# Patient Record
Sex: Female | Born: 1970 | Race: White | Hispanic: No | Marital: Married | Smoking: Former smoker
Health system: Southern US, Community
[De-identification: ages and names within clinical notes are randomized; demographics above are authoritative.]

## PROBLEM LIST (undated history)

## (undated) DIAGNOSIS — K222 Esophageal obstruction: Secondary | ICD-10-CM

## (undated) DIAGNOSIS — K219 Gastro-esophageal reflux disease without esophagitis: Secondary | ICD-10-CM

## (undated) DIAGNOSIS — N840 Polyp of corpus uteri: Secondary | ICD-10-CM

## (undated) DIAGNOSIS — R51 Headache: Secondary | ICD-10-CM

## (undated) DIAGNOSIS — S92909A Unspecified fracture of unspecified foot, initial encounter for closed fracture: Secondary | ICD-10-CM

## (undated) DIAGNOSIS — R011 Cardiac murmur, unspecified: Secondary | ICD-10-CM

## (undated) DIAGNOSIS — K635 Polyp of colon: Secondary | ICD-10-CM

## (undated) DIAGNOSIS — K449 Diaphragmatic hernia without obstruction or gangrene: Secondary | ICD-10-CM

## (undated) DIAGNOSIS — G8929 Other chronic pain: Secondary | ICD-10-CM

## (undated) DIAGNOSIS — R519 Headache, unspecified: Secondary | ICD-10-CM

## (undated) HISTORY — PX: OTHER SURGICAL HISTORY: SHX169

## (undated) HISTORY — DX: Gastro-esophageal reflux disease without esophagitis: K21.9

## (undated) HISTORY — DX: Polyp of colon: K63.5

## (undated) HISTORY — DX: Headache, unspecified: R51.9

## (undated) HISTORY — DX: Polyp of corpus uteri: N84.0

## (undated) HISTORY — DX: Other chronic pain: G89.29

## (undated) HISTORY — DX: Esophageal obstruction: K22.2

## (undated) HISTORY — PX: COLONOSCOPY: SHX174

## (undated) HISTORY — DX: Headache: R51

## (undated) HISTORY — DX: Diaphragmatic hernia without obstruction or gangrene: K44.9

## (undated) HISTORY — DX: Cardiac murmur, unspecified: R01.1

## (undated) HISTORY — PX: POLYPECTOMY: SHX149

---

## 1997-10-28 ENCOUNTER — Ambulatory Visit (HOSPITAL_COMMUNITY): Admission: RE | Admit: 1997-10-28 | Discharge: 1997-10-28 | Payer: Self-pay | Admitting: Internal Medicine

## 1998-10-24 ENCOUNTER — Other Ambulatory Visit: Admission: RE | Admit: 1998-10-24 | Discharge: 1998-10-24 | Payer: Self-pay | Admitting: Obstetrics and Gynecology

## 1999-08-22 ENCOUNTER — Other Ambulatory Visit: Admission: RE | Admit: 1999-08-22 | Discharge: 1999-08-22 | Payer: Self-pay | Admitting: Obstetrics and Gynecology

## 2000-03-29 ENCOUNTER — Inpatient Hospital Stay (HOSPITAL_COMMUNITY): Admission: AD | Admit: 2000-03-29 | Discharge: 2000-04-01 | Payer: Self-pay | Admitting: Obstetrics and Gynecology

## 2000-09-09 ENCOUNTER — Other Ambulatory Visit: Admission: RE | Admit: 2000-09-09 | Discharge: 2000-09-09 | Payer: Self-pay | Admitting: Obstetrics and Gynecology

## 2001-09-24 ENCOUNTER — Other Ambulatory Visit: Admission: RE | Admit: 2001-09-24 | Discharge: 2001-09-24 | Payer: Self-pay | Admitting: Obstetrics and Gynecology

## 2002-04-08 ENCOUNTER — Inpatient Hospital Stay (HOSPITAL_COMMUNITY): Admission: AD | Admit: 2002-04-08 | Discharge: 2002-04-10 | Payer: Self-pay | Admitting: *Deleted

## 2002-10-07 ENCOUNTER — Other Ambulatory Visit: Admission: RE | Admit: 2002-10-07 | Discharge: 2002-10-07 | Payer: Self-pay | Admitting: Obstetrics and Gynecology

## 2004-06-20 ENCOUNTER — Other Ambulatory Visit: Admission: RE | Admit: 2004-06-20 | Discharge: 2004-06-20 | Payer: Self-pay | Admitting: Obstetrics and Gynecology

## 2004-10-24 ENCOUNTER — Ambulatory Visit (HOSPITAL_COMMUNITY): Admission: RE | Admit: 2004-10-24 | Discharge: 2004-10-24 | Payer: Self-pay | Admitting: Orthopedic Surgery

## 2005-04-11 ENCOUNTER — Encounter: Admission: RE | Admit: 2005-04-11 | Discharge: 2005-04-11 | Payer: Self-pay | Admitting: Internal Medicine

## 2005-06-21 ENCOUNTER — Other Ambulatory Visit: Admission: RE | Admit: 2005-06-21 | Discharge: 2005-06-21 | Payer: Self-pay | Admitting: Obstetrics and Gynecology

## 2007-01-03 ENCOUNTER — Emergency Department: Payer: Self-pay | Admitting: Emergency Medicine

## 2007-01-12 ENCOUNTER — Encounter (INDEPENDENT_AMBULATORY_CARE_PROVIDER_SITE_OTHER): Payer: Self-pay | Admitting: Obstetrics and Gynecology

## 2007-01-12 ENCOUNTER — Ambulatory Visit (HOSPITAL_COMMUNITY): Admission: RE | Admit: 2007-01-12 | Discharge: 2007-01-12 | Payer: Self-pay | Admitting: Obstetrics and Gynecology

## 2007-10-02 ENCOUNTER — Encounter: Admission: RE | Admit: 2007-10-02 | Discharge: 2007-10-02 | Payer: Self-pay | Admitting: Internal Medicine

## 2007-10-27 ENCOUNTER — Ambulatory Visit: Payer: Self-pay | Admitting: Gastroenterology

## 2008-01-26 ENCOUNTER — Telehealth: Payer: Self-pay | Admitting: Gastroenterology

## 2008-02-08 ENCOUNTER — Ambulatory Visit: Payer: Self-pay | Admitting: Gastroenterology

## 2008-03-15 ENCOUNTER — Telehealth: Payer: Self-pay | Admitting: Gastroenterology

## 2008-03-22 ENCOUNTER — Encounter: Payer: Self-pay | Admitting: Gastroenterology

## 2008-03-22 ENCOUNTER — Ambulatory Visit: Payer: Self-pay | Admitting: Gastroenterology

## 2008-03-22 DIAGNOSIS — R1084 Generalized abdominal pain: Secondary | ICD-10-CM | POA: Insufficient documentation

## 2008-03-24 ENCOUNTER — Encounter: Payer: Self-pay | Admitting: Gastroenterology

## 2008-03-30 ENCOUNTER — Ambulatory Visit: Payer: Self-pay | Admitting: Cardiovascular Disease

## 2008-04-21 DIAGNOSIS — Z860100 Personal history of colon polyps, unspecified: Secondary | ICD-10-CM | POA: Insufficient documentation

## 2008-08-18 ENCOUNTER — Telehealth: Payer: Self-pay | Admitting: Gastroenterology

## 2008-08-23 ENCOUNTER — Encounter: Admission: RE | Admit: 2008-08-23 | Discharge: 2008-08-23 | Payer: Self-pay | Admitting: Obstetrics and Gynecology

## 2008-08-23 ENCOUNTER — Encounter: Payer: Self-pay | Admitting: Nurse Practitioner

## 2008-10-12 ENCOUNTER — Encounter (INDEPENDENT_AMBULATORY_CARE_PROVIDER_SITE_OTHER): Payer: Self-pay | Admitting: Obstetrics and Gynecology

## 2008-10-12 ENCOUNTER — Ambulatory Visit (HOSPITAL_COMMUNITY): Admission: RE | Admit: 2008-10-12 | Discharge: 2008-10-12 | Payer: Self-pay | Admitting: Obstetrics and Gynecology

## 2009-08-18 ENCOUNTER — Encounter: Payer: Self-pay | Admitting: Nurse Practitioner

## 2009-11-28 ENCOUNTER — Ambulatory Visit: Payer: Self-pay | Admitting: Gastroenterology

## 2009-11-28 ENCOUNTER — Telehealth: Payer: Self-pay | Admitting: Gastroenterology

## 2009-11-28 DIAGNOSIS — R935 Abnormal findings on diagnostic imaging of other abdominal regions, including retroperitoneum: Secondary | ICD-10-CM | POA: Insufficient documentation

## 2009-11-28 DIAGNOSIS — K625 Hemorrhage of anus and rectum: Secondary | ICD-10-CM | POA: Insufficient documentation

## 2009-11-28 DIAGNOSIS — R198 Other specified symptoms and signs involving the digestive system and abdomen: Secondary | ICD-10-CM | POA: Insufficient documentation

## 2009-11-28 DIAGNOSIS — R197 Diarrhea, unspecified: Secondary | ICD-10-CM | POA: Insufficient documentation

## 2009-11-28 LAB — CONVERTED CEMR LAB
ALT: 20 units/L (ref 0–35)
AST: 20 units/L (ref 0–37)
Basophils Absolute: 0 10*3/uL (ref 0.0–0.1)
CO2: 28 meq/L (ref 19–32)
Calcium: 9.8 mg/dL (ref 8.4–10.5)
Chloride: 106 meq/L (ref 96–112)
Creatinine, Ser: 0.7 mg/dL (ref 0.4–1.2)
Eosinophils Absolute: 0.1 10*3/uL (ref 0.0–0.7)
GFR calc non Af Amer: 96.96 mL/min (ref >60)
Lymphocytes Relative: 34.2 % (ref 12.0–46.0)
MCHC: 34.5 g/dL (ref 30.0–36.0)
Monocytes Relative: 7.2 % (ref 3.0–12.0)
Neutrophils Relative %: 56.7 % (ref 43.0–77.0)
Platelets: 335 10*3/uL (ref 150.0–400.0)
RDW: 12.5 % (ref 11.5–14.6)
Sodium: 143 meq/L (ref 135–145)
Total Protein: 7.2 g/dL (ref 6.0–8.3)

## 2009-11-29 ENCOUNTER — Encounter: Payer: Self-pay | Admitting: Nurse Practitioner

## 2009-11-30 ENCOUNTER — Telehealth: Payer: Self-pay | Admitting: Nurse Practitioner

## 2010-04-24 ENCOUNTER — Telehealth (INDEPENDENT_AMBULATORY_CARE_PROVIDER_SITE_OTHER): Payer: Self-pay | Admitting: *Deleted

## 2010-08-11 ENCOUNTER — Encounter: Payer: Self-pay | Admitting: Obstetrics and Gynecology

## 2010-08-21 NOTE — Assessment & Plan Note (Signed)
Summary: bloody diarrhea/sheri    History of Present Illness Visit Type: Follow-up Visit Primary GI MD: Elie Goody MD [facility] Chief Complaint: bloody diarrhea with mucous since last night.  C/o Metalic Taste in mouth, light headed, tingling in arms and fingers, and headaches History of Present Illness:   Followed by Dr. Russella Dar for history of colon polyps, esophageal stricture.  Worked in today for acute nausea, lower abdominal cramping and black stool / rectal bleeding. Nauseated yesterday with two episodes of black stool. This am had one BM which contained bright red blood and mucous.  Overall, does feel better but still "not right". No recent antibiotics, pepto bismul or iron.  No sick contacts or recent travel. Doesn't take NSAIDS.    GI Review of Systems    Reports abdominal pain and  nausea.     Location of  Abdominal pain: lower abdomen.    Denies acid reflux, belching, bloating, chest pain, dysphagia with liquids, dysphagia with solids, heartburn, loss of appetite, vomiting, vomiting blood, weight loss, and  weight gain.      Reports black tarry stools, diarrhea, and  rectal bleeding.     Denies anal fissure, change in bowel habit, constipation, diverticulosis, fecal incontinence, heme positive stool, hemorrhoids, irritable bowel syndrome, jaundice, light color stool, liver problems, and  rectal pain.    Current Medications (verified): 1)  Omeprazole 40 Mg  Cpdr (Omeprazole) .Marland Kitchen.. 1 Each Day 30 Minutes Before Meal  Allergies (verified): No Known Drug Allergies  Past History:  Past Medical History: Reviewed history from 04/21/2008 and no changes required. Adenomatous Colon Polyps Hiatal Hernia Chronic headaches Cardiac murmur (as a child) Esophageal stricture GERD  Past Surgical History: Uterine Polyps Removed   Family History: Reviewed history from 04/21/2008 and no changes required. Family History of Colon Polyps: Aunt Family History of Heart Disease: Maternal  grandmother, grandfather Family History of Breast Cancer: Paternal Aunt Family History of Ovarian Cancer: Maternal Aunt Family History of Diabetes: Father No FH of Colon Cancer:  Social History: Reviewed history from 04/21/2008 and no changes required. Married Patient has never smoked.  Alcohol Use - no Patient gets regular exercise. Daily Caffeine Use Illicit Drug Use - no Drug Use:  no  Review of Systems       The patient complains of fatigue and headaches-new.  The patient denies allergy/sinus, anemia, anxiety-new, arthritis/joint pain, back pain, blood in urine, breast changes/lumps, change in vision, confusion, cough, coughing up blood, depression-new, fainting, fever, hearing problems, heart murmur, heart rhythm changes, itching, menstrual pain, muscle pains/cramps, night sweats, nosebleeds, pregnancy symptoms, shortness of breath, skin rash, sleeping problems, sore throat, swelling of feet/legs, swollen lymph glands, thirst - excessive , urination - excessive , urination changes/pain, urine leakage, vision changes, and voice change.    Vital Signs:  Patient profile:   40 year old female Height:      67 inches Weight:      144.38 pounds BMI:     22.69 Pulse rate:   68 / minute Pulse rhythm:   regular BP sitting:   98 / 62  (left arm)  Vitals Entered By: Milford Cage NCMA (Nov 28, 2009 9:49 AM)  Physical Exam  General:  Well developed, well nourished, no acute distress. Head:  Normocephalic and atraumatic. Eyes:  Conjunctiva pink, no icterus.  Mouth:  No oral lesions. Tongue moist.  Neck:  no obvious masses  Lungs:  Clear throughout to auscultation. Heart:  Regular rate and rhythm; no murmurs, rubs,  or  bruits. Abdomen:  Abdomen soft, nontender, nondistended. Just below umbilicus there is a half dollar size pulsating mass. Normal bowel sounds.  Rectal:  No stool on vault. Heme negative gloved finger. Long, smooth, cord-like structure felt anteriorally on DRE -  patient has tampon in.   Msk:  Symmetrical with no gross deformities. Normal posture. Extremities:  No palmar erythema, no edema.  Neurologic:  Alert and  oriented x4;  grossly normal neurologically. Skin:  Intact without significant lesions or rashes. Cervical Nodes:  No significant cervical adenopathy. Psych:  Alert and cooperative. Normal mood and affect.   Impression & Recommendations:  Problem # 1:  RECTAL BLEEDING (ICD-569.3) Assessment Deteriorated Associated with nausea,  lower abdominal cramping, two episodes of black stool last night followed by bright red blood / mucoid BM this morning. Normal colonoscopy Sept. 2009. Symptoms sound like viral gastroenteritis but sure why the black stool and bloody mucoid stool this am. Better than yesterday but she complains of "just not feeling right". Check basic labs. Send home with stool specimen cups and if no improvement in 2-3 days will obtain stool studies and treat empirically with Flagyl. Unsure why stools would have been black but she is heme negative on exam today.  She hasn't been taking PPI on regular basis. For now, take daily PPI. Patient will be called with test results and any further recommendations based on those results.  Problem # 2:  NONSPEC ABN FINDNG RAD & OTH EXAM ABDOMINAL AREA (ICD-793.6) Assessment: Comment Only Palpating lesion just below umbilicus. Mentioned this to patient and apparently her GYN felt same lesion Jan. 2010 because pelvic U/S and MRI were done. I obtained and reviewed records. Pelvic U/s revealed 1.06 x 0.56 fundal mass. An MRI done for anterior abdominal wall mass was unremarkable  Other Orders: TLB-CBC Platelet - w/Differential (85025-CBCD) TLB-CMP (Comprehensive Metabolic Pnl) (80053-COMP) TLB-CRP-High Sensitivity (C-Reactive Protein) (86140-FCRP) TLB-Sedimentation Rate (ESR) (85652-ESR) T-Culture, C-Diff Toxin A/B (16109-60454) T-Culture, Stool (87045/[id number]) T-Fecal WBC  (09811-91478)  Patient Instructions: 1)  Your physician has requested that you have the following labwork done today: Go to basement level. 2)  Take Omeprazole 30 min before breakfast.  3)  Call us in a few days. If not better turn in stool samples.  4)  The medication list was reviewed and reconciled.  All changed / newly prescribed medications were explained.  A complete medication list was provided to the patient / caregiver.

## 2010-08-21 NOTE — Progress Notes (Signed)
Summary: results request  Medications Added OMEPRAZOLE 40 MG  CPDR (OMEPRAZOLE) 1 each day 30 minutes before meal       Phone Note Call from Patient Call back at [phone]   Caller: Patient Call For: Willette Cluster Reason for Call: Talk to Nurse Summary of Call: specifically would like to speak with Pam regarding labwork results and to let Pam know that she is not feeling better and would like to know what the next step is Initial call taken by: Vallarie Mare,  Nov 30, 2009 11:29 AM  Follow-up for Phone Call        LM for the pt to call me back at ext [phone]. Pt called me back and she has no bleeding but stomach cramping, headache. She also has a metallic taste in her mouth.  She is taking Omeprazole now but has burning in throat and stomach.  Follow-up by: Joselyn Glassman,  Nov 30, 2009 12:11 PM  Additional Follow-up for Phone Call Additional follow up Details #1::        11/30/09  4:15pm - I left a message on patient's home phone for her to call me / Pam Additional Follow-up by: Willette Cluster NP,  Dec 01, 2009 5:31 PM    New/Updated Medications: OMEPRAZOLE 40 MG  CPDR (OMEPRAZOLE) 1 each day 30 minutes before meal Prescriptions: OMEPRAZOLE 40 MG  CPDR (OMEPRAZOLE) 1 each day 30 minutes before meal  #30 x 3   Entered by:   Lowry Ram NCMA   Authorized by:   Willette Cluster NP   Signed by:   Lowry Ram NCMA on 12/07/2009   Method used:   Electronically to        [facility]. [address] [phone]* (retail)       [address] [place], [place]  [id number]       Ph: [phone]       Fax: [phone]   RxID:   [id number]

## 2010-08-21 NOTE — Progress Notes (Signed)
Summary: triage   Phone Note Call from Patient Call back at [phone]   Caller: Patient Call For: Dr. Russella Dar Reason for Call: Talk to Nurse Summary of Call: yesterday two episodes of black, thick diarrhea and abd pain... this morning BM's are bloody and have mucus and a little less abd pain... Initial call taken by: Vallarie Mare,  Nov 28, 2009 8:09 AM  Follow-up for Phone Call        Left message for patient to call back Darcey Nora RN, [facility]  Nov 28, 2009 8:14 AM  patient had abdominal pain all day yesterday and last night had episodes of diarrhea with black stools.  This am she is having bloody diarrhea with mucus and lower abdominal pain.  patient will come in this am and see Willette Cluster RNP at 9:30 Follow-up by: Darcey Nora RN, CGRN,  Nov 28, 2009 8:20 AM

## 2010-08-21 NOTE — Progress Notes (Signed)
  Phone Note Other Incoming   Request: Send information Summary of Call: Request for records received from [facility]. Request forwarded to [facility].     

## 2010-08-28 ENCOUNTER — Ambulatory Visit: Payer: Self-pay

## 2010-11-01 LAB — PREGNANCY, URINE: Preg Test, Ur: NEGATIVE

## 2010-11-01 LAB — CBC
HCT: 40 % (ref 36.0–46.0)
Hemoglobin: 13.7 g/dL (ref 12.0–15.0)
MCHC: 34.3 g/dL (ref 30.0–36.0)
MCV: 98 fL (ref 78.0–100.0)
Platelets: 327 10*3/uL (ref 150–400)
RBC: 4.08 MIL/uL (ref 3.87–5.11)
RDW: 12.1 % (ref 11.5–15.5)
WBC: 6 10*3/uL (ref 4.0–10.5)

## 2010-12-04 NOTE — H&P (Signed)
[name], [name] NO.:  [id number]   MEDICAL RECORD NO.:  [id number]          PATIENT TYPE:  AMB   LOCATION:  SDC                           FACILITY:  [facility]   PHYSICIAN:  Dois Davenport A. Rivard, M.D. DATE OF BIRTH:  04-Mar-1971   DATE OF ADMISSION:  DATE OF DISCHARGE:                              HISTORY & PHYSICAL   HISTORY OF PRESENT ILLNESS:  [name] is a 40 year old married white  female, para-2, 0-0-2, who presents for hysteroscopy with resection of  an endometrial polyp, because of an endometrial polyp and  intramenstrual bleeding.  The patient reports regular menses every 28  days lasting for five days until April when she began spotting between  periods.  A pelvic ultrasound with a sonohysterogram in June revealed a  uterus measuring 7.9 cm x 5.0 cm x 6.2 cm and an endometrial polyp  measuring 0.63 cm x 0.28 cm.  The patient was given the option of  observation or surgical removal of the polyp and she has chosen the  latter.   PAST OB/GYN HISTORY:  1. OB history; gravida-2, para-2, 0-0-2.  2. GYN history; menorrhea 40 years old.  Last menstrual period December 21, 2006.  She does not use any method of contraception.  She denies      any history of abnormal Pap Smears or sexually transmitted      diseases.  Her last normal Pap Smear was December 2007.  Last      normal mammogram January 2008.   PAST MEDICAL HISTORY:  Positive for menstrual migraines.   PAST SURGICAL HISTORY:  Negative.   FAMILY HISTORY:  Hypertension, diabetes, cardiovascular disease, stroke  and breast cancer (paternal aunt).   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   HABITS:  She does not use tobacco.  She consumes alcohol socially.   CURRENT MEDICATIONS:  None.   ALLERGIES:  She has no known drug allergies.   REVIEW OF SYSTEMS:  Review of systems is negative except as is mentioned  in history of present illness.   PHYSICAL EXAMINATION:  VITAL  SIGNS:  Blood pressure is 92/62, weight is  149, height is 5 feet 6.5 inches tall.  NECK:  Neck is supple without masses.  HEART:  Regular rate and rhythm.  LUNGS:  Clear.  BACK:  No CVA tenderness.  ABDOMEN:  No tenderness, masses, or organomegaly.  EXTREMITIES:  No clubbing, cyanosis, or edema.  PELVIC EXAM:  EG/BUS is within normal limits.  Vagina is normal.  Cervix  is nontender without lesions.  Uterus is retroverted, appears 10 to 12  week size without tenderness.  Adnexa; no tenderness or masses.   IMPRESSION:  1. Intramenstrual bleeding.  2. Endometrial polyp.   DISPOSITION:  A discussion was held with the patient regarding the  indications for her procedure along with its risks, which include, but  are not limited to; reaction to anesthesia, damage to adjacent organs,  infection, excessive bleeding and the possibility of endometrial  scarring.  The patient has  consented to proceed with a hysteroscopy with  the resection of an endometrial polyp at [facility]  on January 12, 2007, at 11:00 a.m.      [name].      Crist Fat Rivard, M.D.  Electronically Signed    EJP/MEDQ  D:  01/09/2007  T:  01/09/2007  Job:  [id number]

## 2010-12-04 NOTE — Op Note (Signed)
NAMEARCHER, VISE NO.:  [id number]   MEDICAL RECORD NO.:  [id number]          PATIENT TYPE:  AMB   LOCATION:  SDC                           [facility]  [facility]   PHYSICIAN:  Dois Davenport A. Rivard, M.D. DATE OF BIRTH:  1970/11/09   DATE OF PROCEDURE:  01/12/2007  DATE OF DISCHARGE:                               OPERATIVE REPORT   PREOPERATIVE DIAGNOSIS:  Dysfunctional uterine bleeding with endometrial  polyp.   POSTOPERATIVE DIAGNOSIS:  Dysfunctional uterine bleeding with  endometrial polyp.   ANESTHESIA:  General.   PROCEDURE:  Hysteroscopy with D&C.   SURGEON:  Dr. Estanislado Pandy, no assistant.   ESTIMATED BLOOD LOSS:  Minimal.   PROCEDURE:  After being informed of his left procedure with possible  complications including bleeding, infection, injury to uterus, informed  consent was obtained.  The patient is taken to OR #8, given general  anesthesia with laryngeal mask and placed in lithotomy position.  She is  prepped and draped in a sterile fashion and her bladder is emptied with  an in-and-out Foley catheter.  Pelvic exam reveals a retroverted uterus,  normal in size and shape, two normal adnexa. A weighted speculum is  inserted. Anterior lip of the cervix was grasped with a tenaculum  forceps and we proceed with a paracervical block using Novocain 1% 20 mL  in the usual fashion.  The uterus is then sounded at 7.5 cm and the  cervix was easily dilated using Hegar dilator until #27 which allows  easy entry of the diagnostic hysteroscope.  With sorbitol 3% perfusion  at 90 mmHg maximum pressure we are then able to visualize the entire  uterine cavity which reveals a thickened anterior wall endometrium with  the possibility of a polyp amidst that thickened endometrium. A normal  endometrium on the posterior uterine wall and two tubal ostia that are  normal.  Instruments were removed and a sharp curette is used to empty  the uterine cavity and the polyp was grabbed  during this process and  sent separately.  After curettage is completed diagnostic hysteroscope  is reinserted to visualize an entire uterine cavity with no endometrial  tissue significantly left behind.  Instruments were then removed.  Instrument and sponge count is complete x2.  Estimated blood loss is  minimal.  Water deficit to 75 mL. Procedure is very well tolerated by  the patient who is taken to recovery room in a well and stable  condition.      Crist Fat Rivard, M.D.  Electronically Signed     SAR/MEDQ  D:  01/12/2007  T:  01/12/2007  Job:  [id number]

## 2010-12-04 NOTE — Op Note (Signed)
NAMEJANESIA, [name] NO.:  [id number]   MEDICAL RECORD NO.:  [id number]          PATIENT TYPE:  AMB   LOCATION:  SDC                           [facility]   PHYSICIAN:  Dois Davenport A. Rivard, M.D. DATE OF BIRTH:  February 06, 1971   DATE OF PROCEDURE:  DATE OF DISCHARGE:                               OPERATIVE REPORT   PREOPERATIVE DIAGNOSIS:  Dysfunctional uterine bleeding with endometrial  polyp.   POSTOPERATIVE DIAGNOSIS:  Dysfunctional uterine bleeding with  endometrial polyp.   ANESTHESIA:  IV sedation and paracervical block.   PROCEDURE:  Hysteroscopy, resection of endometrial polyps, and dilation  and curettage.   SURGEON:  Crist Fat. Rivard, MD.   ASSISTANT:  None.   ESTIMATED BLOOD LOSS:  Minimal.   PROCEDURE IN DETAIL:  After being informed of the planned procedure with  possible complications including bleeding, infection, injury to uterus  and recurrence of endometrial polyps, informed consent was obtained.  The patient is taken to OR #3, given the IV sedation and placed in the  lithotomy position.  She is prepped and draped in a sterile fashion and  her bladder is emptied with an in-and-out red rubber catheter.  Pelvic  exam reveals a retroverted uterus, normal in size and shape, two normal  adnexa.  A weighted speculum is inserted in the vagina and the anterior  lip of the cervix was grasped with a tenaculum forceps then with a  Jacobs forceps.  We proceed with a paracervical block using 20 mL of  lidocaine 1% in the usual fashion.  Uterus was sounded at 8 cm and the  cervix was easily dilated using Hegar dilator until #33, which allows  easy entry of the operative hysteroscope.  With perfusion of sorbitol at  a maximum pressure of 90 mmHg we are able to visualize the entire  uterine cavity with both tubal ostia.  A dominant polyp is identified at  the fundus measuring a little less than 0.5 cm, and it is easily  resected.  We note three small  polyps in the right cornua, which are  resected easily as well.  Both tubal ostia is now are easily visualized  and normal.  We removed the hysteroscope and proceed with a sharp  curette for curettage of the reminder of the endometrium, which removes  a normal amount of normal-appearing endometrium.  Instruments were  removed.  The laceration on the anterior lip of the cervix was sutured  with a running lock suture of 2-0 Vicryl.  Instruments and sponge count  is complete x2.  Estimated blood loss is minimal, and fluid deficit  varies between 50 and 100 mL; having had leak on the floor, it is  difficult to be precise in its evaluation.   The procedure is very well tolerated by the patient who is taken to  recovery room in a well and stable condition.   SPECIMEN:  Endometrial polyps and endometrial curettings sent to  Pathology.      Crist Fat Rivard, M.D.  Electronically Signed     SAR/MEDQ  D:  10/12/2008  T:  10/13/2008  Job:  [id number]

## 2010-12-04 NOTE — Assessment & Plan Note (Signed)
[facility]                         GASTROENTEROLOGY OFFICE NOTE   Patterson, Tonya                         MRN:          [id number]  DATE:10/27/2007                            DOB:          1970/08/15    REFERRING PHYSICIAN:  Larina Earthly, M.D.   PHYSICIAN REQUESTING CONSULTATION:  Larina Earthly, M.D.   REASON FOR CONSULTATION:  Dysphagia, abdominal pain, pressure and  bloating, and an abnormal upper GI series.   HISTORY OF PRESENT ILLNESS:  [name] is a 40 year old white female  that I have evaluated in the past.  She underwent colonoscopy for  diarrhea, hematochezia, and lower back pain in December of 2004, which  showed a small adenomatous colon polyp and no evidence of colitis or  hemorrhoids.  She states she has had worsening problems with gas,  bloating, constipation, epigastric pain, heartburn, indigestion, nausea  and solid food dysphagia over the past several months.  A complete  metabolic panel and CBC from March 2009 were unremarkable.  An upper GI  series from October 02, 2007 showed a small sliding hiatal hernia  associated with a distal esophageal ring.  There was hold up of a 13 mm  barium tablet noted.  She occasionally has noted small amounts of  hematochezia.  She notes no odynophagia, change in appetite or weight  loss.   FAMILY HISTORY:  Remarkable for an aunt with colon polyps.  No other  family members with colon polyps, colon cancer or inflammatory bowel  disease.   PAST MEDICAL HISTORY:  1. Chronic headaches.  2. Prior hysteroscopy.   MEDICATION ALLERGIES:  None known.   CURRENT MEDICATIONS:  1. Zofran 4 mg t.i.d. p.r.n.  2. Omeprazole 40 mg daily p.r.n.   SOCIAL HISTORY/REVIEW OF SYSTEMS:  Per the handwritten form.   PHYSICAL EXAMINATION:  Well developed, well nourished white female in no  acute distress.  Height 5 feet 7 inches.  Weight 149.8 pounds.  Blood  pressure is 96/60, pulse 70 and regular.  HEENT:  Pupils  are equal.  Anicteric sclerae.  Oropharynx clear.  CHEST:  Clear to auscultation bilaterally.  CARDIAC:  Regular rate and rhythm without murmurs appreciated.  ABDOMEN:  Soft, nondistended.  Minimal epigastric tenderness to deep  palpation without rebound or guarding.  No palpable organomegaly, masses  or hernias.  Normoactive bowel sounds.  EXTREMITIES:  No clubbing, cyanosis or edema.  NEUROLOGIC:  Alert and oriented x3.  Grossly nonfocal.   ASSESSMENT/PLAN:  1. Epigastric pain, reflux symptoms, dysphagia and a distal esophageal      stricture.  Take omeprazole 40 mg p.o. q.a.m. on a regular basis      and begin all standard antireflux measures.  Risks, benefits and      alternatives to upper endoscopy with possible biopsy and possible      dilation discussed with the patient and she consents to proceed.      This will be scheduled electively.  2. Change in bowel habits with worsening constipation and lower      abdominal pain, small volume hematochezia and a personal history of  adenomatous colon polyps.  Rule out colorectal neoplasms,      hemorrhoids, proctitis and other disorders.  Risks, benefits and      alternatives to colonoscopy with possible biopsy, possible      polypectomy and possible destruction of internal hemorrhoids      discussed with the patient.  She consents to proceed and this will      be scheduled electively at the time of her upper endoscopy.     Venita Lick. Russella Dar, MD, [facility]  Electronically Signed    MTS/MedQ  DD: 11/02/2007  DT: 11/02/2007  Job #: [id number]   cc:   Larina Earthly, M.D.

## 2010-12-07 NOTE — H&P (Signed)
[name] NO.:  [id number]   MEDICAL RECORD NO.:  [id number]                   PATIENT TYPE:  MAT   LOCATION:  [facility]                                 FACILITY:  [facility]   PHYSICIAN:  [name], M.D.                DATE OF BIRTH:  1971-05-10   DATE OF ADMISSION:  04/08/2002  DATE OF DISCHARGE:                                HISTORY & PHYSICAL   HISTORY OF PRESENT ILLNESS:  The patient is a 40 year old married white  female gravida 2, para 1-0-0-1 at 79 and 4/7 weeks who presents with regular  uterine contractions this evening.  She denies leaking, bleeding, headache,  nausea, vomiting, or visual disturbances.  She reports positive fetal  movement.  Her pregnancy has been followed by the [facility]  certified nurse midwife service and is remarkable for family history of  congenital heart disease, group B Strep positive.  Her cervical examination  in the office on September 17 was 2 cm dilated.  Her prenatal laboratories  were collected on September 24, 2001.  Her hemoglobin was 13.6, hematocrit 48.7,  platelets 310,000.  Blood type A+.  Antibody negative.  RPR nonreactive.  Rubella immune.  Hepatitis B surface antigen negative.  Pap smear within  normal limits.  Gonorrhea negative.  Chlamydia negative.  On October 21, 2001  her maternal serum alpha fetoprotein was within normal range.  On January 14, 2002 her one hour Glucola was 93.  Culture of the vaginal tract for group B  Strep on March 25, 2002 was positive.   HISTORY OF PRESENT PREGNANCY:  She presented for care on September 24, 2001 at  approximately 10-1/[redacted] weeks gestation.  Pregnancy ultrasonography at 18-1/[redacted]  weeks gestation showed growth consistent with last menstrual period dating.  All anatomy was seen except for cord insertion site.  Follow-up ultrasound  was recommended to patient and she declined.  At [redacted] weeks gestation she had  an episode of increased vaginal pressure and  possible hemorrhoids and upon  examination she had a nonthrombosed hemorrhoid just inside the anus.  It was  recommended that she take Colace as well as increase her fluids.  Most of  her prenatal care was unremarkable.  She is a gravida 2, para 1-0-0-1.  In  September 2001 she vaginally delivered a female infant at 23 and 2/[redacted] weeks  gestation.  Infant weighed 7 pounds 14 ounces at birth.  She was in labor  for 11 hours.  Had an epidural for anesthesia.  Infant's name was [name]  and she had positive group B Strep with that pregnancy.   ALLERGIES:  She has no medication allergies.   MEDICATIONS:  She has used NuvaRing for contraception which she stopped in  August 2002.   PAST MEDICAL HISTORY:  She has a history of oral cold sores, but never has  had genital  HSV.  She had an occasional cystitis.   PAST SURGICAL HISTORY:  Remarkable for wisdom teeth extraction.   FAMILY HISTORY:  Remarkable for maternal grandfather, maternal uncle,  maternal grandmother, and paternal uncle with heart disease and myocardial  infarction, father with history of chronic hypertension, maternal first  cousin with history of Graves' disease, maternal aunt with breast cancer,  maternal grandmother with history of stroke.   GENETIC HISTORY:  Remarkable for father of the baby's first cousin with  mental retardation, patient's first cousin with a heart problem of hyaline  membrane disease.   SOCIAL HISTORY:  The patient is married to the father of the baby.  His name  is [name].  He is involved and supportive.  The patient is a high Nurse, mental health.  Father of the baby is a Environmental manager.  They have [employer]  faith.  They deny any alcohol, tobacco, or illicit drug use with the  pregnancy.   OBJECTIVE:  VITAL SIGNS:  Stable.  She is afebrile.  HEENT:  Grossly within normal limits.  CHEST:  Clear to auscultation.  HEART:  Regular rate and rhythm.  BREASTS:  Soft and nontender.  ABDOMEN:  Gravid in  contour with fundal height extending approximately 39 cm  above the pubic symphysis.  Electronic fetal monitoring is remarkable for  reactive and reassuring fetal heart rate.  Uterine contractions every two to  five minutes lasting 60 seconds.  Contractions are mild to moderate.  There  is occasional coupling of the contractions.  PELVIC:  Cervical examination is 4 cm, 60% effaced, vertex -2 per R.N.  examination.  EXTREMITIES:  Within normal limits.   ASSESSMENT:  1. Intrauterine pregnancy at term.  2. Early labor.  3. Group B Strep positive.   PLAN:  1. Admit to birthing suite for consult with Dr. Su Hilt.  2. Routine C.N.M. orders.  3. Begin penicillin G for GBS prophylaxis.  4. Ambulate after first dose of penicillin.     [name], C.N.M.                     [name], M.D.    KS/MEDQ  D:  04/08/2002  T:  04/08/2002  Job:  [id number]

## 2010-12-07 NOTE — H&P (Signed)
[facility]  Patient:    [name], [name]                           MRN: [id number] Adm. Date:  [id number] Attending:  Cleatrice Burke Dictator:   Vance Gather Duplantis, C.N.M.                         History and Physical  HISTORY OF PRESENT ILLNESS:   Mrs. Sparano is a 40 year old, married, white female, Gravida 1, Para 0 at 41-2/7 weeks who presents for evaluation complaining of possibly leaking a small amount of clear liquid since about 9 p.m. last night.  She reports irregular mild uterine contractions.  She reports positive fetal movement.  She denies any nausea, vomiting, headaches or visual disturbances.  Her pregnancy has been followed at [facility] by the certified nurse midwife service and has been essentially uncomplicated.  She does have positive Group B Streptococcus.  OB/GYN HISTORY:               She is a primigravida with an LMP of June 14, 1999 with an [facility] of March 21, 2000 confirmed by ultrasound.  ALLERGIES:                    She has no known drug allergies.  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  Her other medical history is noncontributory.  She has a history of occasional urinary tract infections. Her only surgery was for her wisdom teeth to be removed in October 2000.  FAMILY HISTORY:               Significant for several family members with heart disease.  Father with hypertension.  First maternal cousin with Graves disease.  Paternal aunt with breast cancer.  GENETIC HISTORY:              Significant for first cousin with a heart malfunction congenital heart disease.  Father of babys first cousin with mild retardation.  SOCIAL HISTORY:               She is married to [place] Mccoy, who was involved and supportive.  They are of the [place] faith.  She is employed full time as a Psychologist, forensic.  He is a full time Environmental manager.   They deny any illicit drug use, alcohol or smoking with this pregnancy.  PRENATAL LABORATORY DATA:      Her blood type is A positive.  Her antibody screen is negative.  Syphilis is nonreactive.  Rubella is positive.  Hepatitis B surface antigen is negative.  HIV is nonreactive.  GC and Chlamydia are both negative.  Pap is within normal limits. One hour Glucola is negative. Maternal serum alpha fetoprotein was within normal range. Her Group B Streptococcus was positive at 36 weeks.  PHYSICAL EXAMINATION:  VITAL SIGNS:                  Stable.  She is afebrile.  HEENT:                        Grossly within normal limits.  HEART:  Regular rhythm and rate.  CHEST:                        Clear.  BREASTS:                      Soft and nontender.  ABDOMEN:                      Gravid with uterine contractions that are every six to ten minutes and mild. Her fetal heart rate is reactive and reassuring.  PELVIC:                       Sterile speculum exam revealed positive pooling, positive ferning, equivocal nitrazine.  Cervix is 4 cm, 80% vertex, posterior with forewaters noted.  EXTREMITIES:                  Within normal limits.  ASSESSMENT:                   1. Intrauterine pregnancy at term.                               2. Spontaneous rupture of membranes, possibly                                  greater 12 hours.                               3. Positive Group B Streptococcus.                               4. Early labor.  PLAN:                         Admit to labor and delivery.  To follow routine CNM orders.  To give her penicillin for Group B Streptococcus prophylaxis and to plan artificial of the membranes for forewaters in order to augment her labor.  Dr. Cleatrice Burke has been notified of the patients admission.DD: 03/29/00 TD:  04/01/00 Job: [id number]

## 2011-05-08 LAB — CBC
HCT: 40.3
Hemoglobin: 13.7
MCHC: 34
Platelets: 301
RDW: 12.4

## 2011-05-23 ENCOUNTER — Encounter: Payer: Self-pay | Admitting: Family Medicine

## 2011-05-23 ENCOUNTER — Ambulatory Visit (INDEPENDENT_AMBULATORY_CARE_PROVIDER_SITE_OTHER): Payer: PRIVATE HEALTH INSURANCE | Admitting: Family Medicine

## 2011-05-23 VITALS — BP 100/72 | HR 60 | Temp 98.2°F | Ht 67.0 in | Wt 150.8 lb

## 2011-05-23 DIAGNOSIS — R002 Palpitations: Secondary | ICD-10-CM | POA: Insufficient documentation

## 2011-05-23 LAB — BASIC METABOLIC PANEL WITH GFR
BUN: 14 mg/dL (ref 6–23)
CO2: 25 meq/L (ref 19–32)
Calcium: 9.1 mg/dL (ref 8.4–10.5)
Chloride: 109 meq/L (ref 96–112)
Creatinine, Ser: 0.7 mg/dL (ref 0.4–1.2)
GFR: 96.25 mL/min
Glucose, Bld: 86 mg/dL (ref 70–99)
Potassium: 3.9 meq/L (ref 3.5–5.1)
Sodium: 140 meq/L (ref 135–145)

## 2011-05-23 LAB — CBC WITH DIFFERENTIAL/PLATELET
Basophils Absolute: 0 10*3/uL (ref 0.0–0.1)
Basophils Relative: 0.4 % (ref 0.0–3.0)
Eosinophils Absolute: 0.1 10*3/uL (ref 0.0–0.7)
Eosinophils Relative: 1.4 % (ref 0.0–5.0)
HCT: 38.7 % (ref 36.0–46.0)
Hemoglobin: 13.1 g/dL (ref 12.0–15.0)
Lymphocytes Relative: 42.5 % (ref 12.0–46.0)
Lymphs Abs: 3 10*3/uL (ref 0.7–4.0)
MCHC: 33.9 g/dL (ref 30.0–36.0)
MCV: 99.9 fl (ref 78.0–100.0)
Monocytes Absolute: 0.5 10*3/uL (ref 0.1–1.0)
Monocytes Relative: 6.5 % (ref 3.0–12.0)
Neutro Abs: 3.5 10*3/uL (ref 1.4–7.7)
Neutrophils Relative %: 49.2 % (ref 43.0–77.0)
Platelets: 279 10*3/uL (ref 150.0–400.0)
RBC: 3.88 Mil/uL (ref 3.87–5.11)
RDW: 12.9 % (ref 11.5–14.6)
WBC: 7 10*3/uL (ref 4.5–10.5)

## 2011-05-23 LAB — T4, FREE: Free T4: 1.07 ng/dL (ref 0.60–1.60)

## 2011-05-23 NOTE — Patient Instructions (Signed)
It was wonderful to meet you. We will call you with your lab results in the next day or so.

## 2011-05-23 NOTE — Progress Notes (Signed)
Subjective:    Patient ID: [name], female    DOB: 11/01/1970, 40 y.o.   MRN: [id number]  HPI 40 yo here to establish care and discuss heart palpitations.  On and off since April. Typically takes her breath away when the occur. No CP, no dizziness. Not worsened by exertion. Does not feel that she is under more stress and is not an anxious person. Denies any symptoms of hyperthyroidism.  Nothing seems to make it better or worse. Has been occuring at least once a month.  Patient Active Problem List  Diagnoses  . RECTAL BLEEDING  . DIARRHEA, BLOODY  . CHANGE IN BOWELS  . ABDOMINAL PAIN, GENERALIZED  . NONSPEC ABN FINDNG RAD & OTH EXAM ABDOMINAL AREA  . COLONIC POLYPS, ADENOMATOUS, HX OF  . Heart palpitations   Past Medical History  Diagnosis Date  . Uterine polyp   . Colon polyps    Past Surgical History  Procedure Date  . Polypectomy     uterine   History  Substance Use Topics  . Smoking status: Never Smoker   . Smokeless tobacco: Not on file  . Alcohol Use: Not on file   Family History  Problem Relation Age of Onset  . Diabetes Father   . Hyperlipidemia Father   . Hypertension Father   . Cancer Paternal Aunt 56    breast   No Known Allergies No current outpatient prescriptions on file prior to visit.   The PMH, PSH, Social History, Family History, Medications, and allergies have been reviewed in [facility], and have been updated if relevant.    Review of Systems    See HPI Patient reports no  vision/ hearing changes,anorexia, weight change, fever ,adenopathy, persistant / recurrent hoarseness, swallowing issues, chest pain, edema,persistant / recurrent cough, hemoptysis, dyspnea(rest, exertional, paroxysmal nocturnal), gastrointestinal  bleeding (melena, rectal bleeding), abdominal pain, excessive heart burn, GU symptoms(dysuria, hematuria, pyuria, voiding/incontinence  Issues) syncope, focal weakness, severe memory loss, concerning skin lesions, depression,  anxiety, abnormal bruising/bleeding, major joint swelling, breast masses or abnormal vaginal bleeding.    Objective:   Physical Exam BP 100/72  Pulse 60  Temp(Src) 98.2 F (36.8 C) (Oral)  Ht 5\' 7"  (1.702 m)  Wt 150 lb 12 oz (68.38 kg)  BMI 23.61 kg/m2  LMP 04/24/2011  General:  Well-developed,well-nourished,in no acute distress; alert,appropriate and cooperative throughout examination Head:  normocephalic and atraumatic.   Eyes:  vision grossly intact, pupils equal, pupils round, and pupils reactive to light.   Ears:  R ear normal and L ear normal.   Nose:  no external deformity.   Mouth:  good dentition.   Neck:  No deformities, masses, or tenderness noted. Lungs:  Normal respiratory effort, chest expands symmetrically. Lungs are clear to auscultation, no crackles or wheezes. Heart:  Normal rate and regular rhythm. S1 and S2 normal without gallop, murmur, click, rub or other extra sounds. Msk:  No deformity or scoliosis noted of thoracic or lumbar spine.   Extremities:  No clubbing, cyanosis, edema, or deformity noted with normal full range of motion of all joints.   Neurologic:  alert & oriented X3 and gait normal.   Skin:  Intact without suspicious lesions or rashes Psych:  Cognition and judgment appear intact. Alert and cooperative with normal attention span and concentration. No apparent delusions, illusions, hallucinations     Assessment & Plan:   1. Heart palpitations  TSH, T4, free, CBC w/Diff, Basic Metabolic Panel (BMET)   New and intermittent. EKG-  NSR and no change from prior compared to old records from 05/2005. Also had normal 2decho in 2006 but she cannot remember why (awaiting all of her old records). Will check labs today. If all labs within normal limits, will get Holter monitor. The patient indicates understanding of these issues and agrees with the plan.

## 2011-06-06 ENCOUNTER — Telehealth: Payer: Self-pay | Admitting: *Deleted

## 2011-06-06 DIAGNOSIS — R002 Palpitations: Secondary | ICD-10-CM

## 2011-06-06 NOTE — Telephone Encounter (Signed)
Will place cardiology referral.

## 2011-06-06 NOTE — Telephone Encounter (Signed)
Pt says she was told to let you know when she was ready for holter monitor for heart palpitations, she is now ready.

## 2011-06-18 ENCOUNTER — Other Ambulatory Visit: Payer: Self-pay | Admitting: Gastroenterology

## 2011-06-18 NOTE — Telephone Encounter (Signed)
Left a message for patient stating that she was never prescribed Nexium from [place] and that she has not been seen in over a year and needs to be seen in the office before she can have any more refills.

## 2011-06-19 NOTE — Telephone Encounter (Signed)
Left another message for patient to schedule a office visit and return my call.

## 2011-06-24 ENCOUNTER — Encounter: Payer: Self-pay | Admitting: Cardiovascular Disease

## 2011-06-25 ENCOUNTER — Encounter: Payer: Self-pay | Admitting: Cardiovascular Disease

## 2011-06-25 ENCOUNTER — Ambulatory Visit (INDEPENDENT_AMBULATORY_CARE_PROVIDER_SITE_OTHER): Payer: PRIVATE HEALTH INSURANCE | Admitting: Cardiovascular Disease

## 2011-06-25 VITALS — BP 108/72 | HR 68 | Ht 67.0 in | Wt 153.0 lb

## 2011-06-25 DIAGNOSIS — R002 Palpitations: Secondary | ICD-10-CM

## 2011-06-25 NOTE — Patient Instructions (Signed)
You are doing well. No medication changes were made. We will set up a monitor for 48 hrs to look at your rhythm/palpitations.  Please call us if you have new issues that need to be addressed before your next appt.

## 2011-06-25 NOTE — Assessment & Plan Note (Addendum)
Etiology of her palpitations and fluttering is likely secondary to ectopy. Unable to exclude short runs of SVT or atrial tachycardia. We have ordered a Holter monitor to evaluate her arrhythmia. She is not particularly eager to take medication such as beta blocker even on an as-needed basis. We have given her several samples of bystolic 5 mg that she can take on an as-needed basis for significant palpitations. Alternatives would be short acting propranolol or metoprolol tartrate at low dose.  We will call her with the results of her Holter monitor and set up a followup if needed.  Previous echocardiogram was normal. Clinical exam is essentially benign. No further workup is needed at this time. TSH is borderline low with normal T4.

## 2011-06-25 NOTE — Progress Notes (Signed)
   Patient ID: [name], female    DOB: [birth year], 40 y.o.   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 She was referred by Dr. Dayton Martes for further evaluation.  She reports no significant cardiac history. She does not have shortness of breath with exertion and she is able to exercise without any problems. She had an echocardiogram 6 years that was essentially normal. EKGs have been essentially normal. She has not worn a Holter monitor in the past.  She reports having episodes of fluttering several times per day, daytime as well as nighttime. She wonders if her menstrual cycles make the symptoms worse. The fluttering is short, lasting less than one minute with no triggers. She denies any heavy coffee use, cold medicines or other stimulants. No other significant stressors that are new in the past 6 months.  EKG today shows normal sinus rhythm with rate 68 beats per minute with no significant ST or T wave changes   Outpatient Encounter Prescriptions as of 06/25/2011  Medication Sig Dispense Refill  . omeprazole (PRILOSEC) 40 MG capsule Take 40 mg by mouth daily.           Review of Systems  Constitutional: Negative.   HENT: Negative.   Eyes: Negative.   Respiratory: Negative.   Cardiovascular: Positive for palpitations.       Fluttering  Gastrointestinal: Negative.   Musculoskeletal: Negative.   Skin: Negative.   Neurological: Negative.   Hematological: Negative.   Psychiatric/Behavioral: Negative.   All other systems reviewed and are negative.    BP 108/72  Pulse 68  Ht 5\' 7"  (1.702 m)  Wt 153 lb (69.4 kg)  BMI 23.96 kg/m2   Physical Exam  Nursing note and vitals reviewed. Constitutional: She is oriented to person, place, and time. She appears well-developed and well-nourished.  HENT:  Head: Normocephalic.  Nose: Nose normal.    Mouth/Throat: Oropharynx is clear and moist.  Eyes: Conjunctivae are normal. Pupils are equal, round, and reactive to light.  Neck: Normal range of motion. Neck supple. No JVD present.  Cardiovascular: Normal rate, regular rhythm, S1 normal, S2 normal, normal heart sounds and intact distal pulses.  Exam reveals no gallop and no friction rub.   No murmur heard. Pulmonary/Chest: Effort normal and breath sounds normal. No respiratory distress. She has no wheezes. She has no rales. She exhibits no tenderness.  Abdominal: Soft. Bowel sounds are normal. She exhibits no distension. There is no tenderness.  Musculoskeletal: Normal range of motion. She exhibits no edema and no tenderness.  Lymphadenopathy:    She has no cervical adenopathy.  Neurological: She is alert and oriented to person, place, and time. Coordination normal.  Skin: Skin is warm and dry. No rash noted. No erythema.  Psychiatric: She has a normal mood and affect. Her behavior is normal. Judgment and thought content normal.         Assessment and Plan

## 2011-06-28 NOTE — Progress Notes (Signed)
Addended by: Festus Aloe on: 06/28/2011 08:38 AM   Modules accepted: Orders

## 2011-07-02 ENCOUNTER — Telehealth: Payer: Self-pay | Admitting: *Deleted

## 2011-07-02 NOTE — Telephone Encounter (Signed)
Returning Megan's call for holter monitor results....willl be in meeting 3-4:30pm, will be available after those hours.

## 2011-07-02 NOTE — Telephone Encounter (Signed)
Calling pt to notify of holter results, per Dr. Mariah Milling, NSR with periods of sinus tachycardia, rates to 140s (usually during daytime). Rare APCs. If she wants f/u or start beta blocker will schedule. Attempted to contact pt, LMOM TCB.

## 2011-07-03 NOTE — Telephone Encounter (Signed)
lmtcb Debbie Shauni Henner RN  

## 2011-07-03 NOTE — Telephone Encounter (Signed)
Lmtcb. Debbie Myrle Dues RN  

## 2011-07-04 NOTE — Telephone Encounter (Signed)
Pt had left msg on vm that if I do not reach her to leave vm on her cell. Attempted to contact pt, LMOM of info below.

## 2011-09-19 ENCOUNTER — Ambulatory Visit (INDEPENDENT_AMBULATORY_CARE_PROVIDER_SITE_OTHER): Payer: PRIVATE HEALTH INSURANCE | Admitting: Registered Nurse

## 2011-09-19 DIAGNOSIS — Z01419 Encounter for gynecological examination (general) (routine) without abnormal findings: Secondary | ICD-10-CM

## 2012-05-28 ENCOUNTER — Encounter: Payer: Self-pay | Admitting: Obstetrics and Gynecology

## 2012-05-28 NOTE — Progress Notes (Signed)
Quick Note:  Please send "Dense breast" letter to patient and document in chart when letter is sent. ______ 

## 2012-06-08 ENCOUNTER — Telehealth: Payer: Self-pay | Admitting: Obstetrics and Gynecology

## 2012-06-23 ENCOUNTER — Encounter: Payer: Self-pay | Admitting: Obstetrics and Gynecology

## 2012-06-23 ENCOUNTER — Ambulatory Visit (INDEPENDENT_AMBULATORY_CARE_PROVIDER_SITE_OTHER): Payer: PRIVATE HEALTH INSURANCE | Admitting: Obstetrics and Gynecology

## 2012-06-23 VITALS — BP 98/60 | Ht 67.0 in | Wt 152.0 lb

## 2012-06-23 DIAGNOSIS — R922 Inconclusive mammogram: Secondary | ICD-10-CM

## 2012-06-23 NOTE — Progress Notes (Signed)
Subjective:    [name] is a 41 y.o. female, G2P2, who presents for discussion of mammogram results on 05/28/2012: normal with heterogeneously dense breast. Here because is concerned about the dense breast letter   The following portions of the patient's history were reviewed and updated as appropriate: allergies, current medications, past family history.  Review of Systems Pertinent items are noted in HPI. Breast:Negative for breast lump,nipple discharge or nipple retraction    Objective:    BP 98/60  Ht 5\' 7"  (1.702 m)  Wt 152 lb (68.947 kg)  BMI 23.81 kg/m2  LMP 06/18/2012    Weight:  Wt Readings from Last 1 Encounters:  06/23/12 152 lb (68.947 kg)          BMI: Body mass index is 23.81 kg/(m^2).  General Appearance: Alert, appropriate appearance for age. No acute distress    Assessment:    Normal mammogram with dense breast    Plan:    AEX 08/2012  Silverio Lay MD

## 2012-10-07 ENCOUNTER — Other Ambulatory Visit: Payer: Self-pay | Admitting: Obstetrics and Gynecology

## 2012-10-08 LAB — PAP IG W/ RFLX HPV ASCU

## 2013-02-24 ENCOUNTER — Encounter: Payer: Self-pay | Admitting: Gastroenterology

## 2013-03-10 ENCOUNTER — Encounter: Payer: Self-pay | Admitting: Gastroenterology

## 2013-03-16 ENCOUNTER — Ambulatory Visit (AMBULATORY_SURGERY_CENTER): Payer: PRIVATE HEALTH INSURANCE

## 2013-03-16 VITALS — Ht 67.0 in | Wt 153.4 lb

## 2013-03-16 DIAGNOSIS — Z860100 Personal history of colon polyps, unspecified: Secondary | ICD-10-CM

## 2013-03-16 MED ORDER — PREPOPIK 10-3.5-12 MG-GM-GM PO PACK: 1.0000 | PACK | ORAL | Status: DC

## 2013-03-31 ENCOUNTER — Encounter: Payer: Self-pay | Admitting: Gastroenterology

## 2013-04-05 ENCOUNTER — Encounter: Payer: Self-pay | Admitting: Gastroenterology

## 2013-04-05 ENCOUNTER — Ambulatory Visit (AMBULATORY_SURGERY_CENTER): Payer: PRIVATE HEALTH INSURANCE | Admitting: Gastroenterology

## 2013-04-05 VITALS — BP 104/61 | HR 65 | Temp 97.2°F | Resp 16 | Ht 67.0 in | Wt 153.0 lb

## 2013-04-05 DIAGNOSIS — Z860100 Personal history of colon polyps, unspecified: Secondary | ICD-10-CM

## 2013-04-05 MED ORDER — SODIUM CHLORIDE 0.9 % IV SOLN: 500.0000 mL | INTRAVENOUS | Status: DC

## 2013-04-05 NOTE — Patient Instructions (Addendum)
YOU HAD AN ENDOSCOPIC PROCEDURE TODAY AT THE [facility]: Refer to the procedure report that was given to you for any specific questions about what was found during the examination.  If the procedure report does not answer your questions, please call your gastroenterologist to clarify.  If you requested that your care partner not be given the details of your procedure findings, then the procedure report has been included in a sealed envelope for you to review at your convenience later.  YOU SHOULD EXPECT: Some feelings of bloating in the abdomen. Passage of more gas than usual.  Walking can help get rid of the air that was put into your GI tract during the procedure and reduce the bloating. If you had a lower endoscopy (such as a colonoscopy or flexible sigmoidoscopy) you may notice spotting of blood in your stool or on the toilet paper. If you underwent a bowel prep for your procedure, then you may not have a normal bowel movement for a few days.  DIET: Your first meal following the procedure should be a light meal and then it is ok to progress to your normal diet.  A half-sandwich or bowl of soup is an example of a good first meal.  Heavy or fried foods are harder to digest and may make you feel nauseous or bloated.  Likewise meals heavy in dairy and vegetables can cause extra gas to form and this can also increase the bloating.  Drink plenty of fluids but you should avoid alcoholic beverages for 24 hours.  ACTIVITY: Your care partner should take you home directly after the procedure.  You should plan to take it easy, moving slowly for the rest of the day.  You can resume normal activity the day after the procedure however you should NOT DRIVE or use heavy machinery for 24 hours (because of the sedation medicines used during the test).    SYMPTOMS TO REPORT IMMEDIATELY: A gastroenterologist can be reached at any hour.  During normal business hours, 8:30 AM to 5:00 PM Monday through Friday,  call [phone].  After hours and on weekends, please call the GI answering service at [phone] who will take a message and have the physician on call contact you.   Following lower endoscopy (colonoscopy or flexible sigmoidoscopy):  Excessive amounts of blood in the stool  Significant tenderness or worsening of abdominal pains  Swelling of the abdomen that is new, acute  Fever of 100F or higher   FOLLOW UP: If any biopsies were taken you will be contacted by phone or by letter within the next 1-3 weeks.  Call your gastroenterologist if you have not heard about the biopsies in 3 weeks.  Our staff will call the home number listed on your records the next business day following your procedure to check on you and address any questions or concerns that you may have at that time regarding the information given to you following your procedure. This is a courtesy call and so if there is no answer at the home number and we have not heard from you through the emergency physician on call, we will assume that you have returned to your regular daily activities without incident.  SIGNATURES/CONFIDENTIALITY: You and/or your care partner have signed paperwork which will be entered into your electronic medical record.  These signatures attest to the fact that that the information above on your After Visit Summary has been reviewed and is understood.  Full responsibility of the confidentiality of   this discharge information lies with you and/or your care-partner.    Normal colon today. Repeat exam in 5 years. You may resume your current medications today. Please call if any questions or concerns.

## 2013-04-05 NOTE — Progress Notes (Signed)
  [facility] Anesthesia Post-op Note  Patient: [name]  Procedure(s) Performed: colonoscopy  Patient Location: [facility] - Recovery Area  Anesthesia Type: Deep Sedation/Propofol  Level of Consciousness: awake, oriented and patient cooperative  Airway and Oxygen Therapy: Patient Spontanous Breathing  Post-op Pain: none  Post-op Assessment:  Post-op Vital signs reviewed, Patient's Cardiovascular Status Stable, Respiratory Function Stable, Patent Airway, No signs of Nausea or vomiting and Pain level controlled  Post-op Vital Signs: Reviewed and stable  Complications: No apparent anesthesia complications  Damarys Speir E 9:56 AM

## 2013-04-05 NOTE — Op Note (Signed)
 [facility] [address] [place], [address]   COLONOSCOPY PROCEDURE REPORT  PATIENT: [name], [name]  MR#: [id number] BIRTHDATE: 1970/10/20 , 42  yrs. old GENDER: Female ENDOSCOPIST: Meryl Dare, MD, [facility] PROCEDURE DATE:  04/05/2013 PROCEDURE:   Colonoscopy, screening First Screening Colonoscopy - Avg.  risk and is 50 yrs.  old or older - No.  Prior Negative Screening - Now for repeat screening. N/A  History of Adenoma - Now for follow-up colonoscopy & has been > or = to 3 yrs.  Yes hx of adenoma.  Has been 3 or more years since last colonoscopy.  Polyps Removed Today? No.  Recommend repeat exam, <10 yrs? Yes.  High risk (family or personal hx). ASA CLASS:   Class II INDICATIONS:Patient's personal history of adenomatous colon polyps.  MEDICATIONS: MAC sedation, administered by CRNA and propofol (Diprivan) 200mg  IV DESCRIPTION OF PROCEDURE:   After the risks benefits and alternatives of the procedure were thoroughly explained, informed consent was obtained.  A digital rectal exam revealed no abnormalities of the rectum.   The LB [id number]  endoscope was introduced through the anus and advanced to the cecum, which was identified by both the appendix and ileocecal valve. No adverse events experienced.   The quality of the prep was [place] good The instrument was then slowly withdrawn as the colon was fully examined.  COLON FINDINGS: A normal appearing cecum, ileocecal valve, and appendiceal orifice were identified.  The ascending, hepatic flexure, transverse, splenic flexure, descending, sigmoid colon and rectum appeared unremarkable.  No polyps or cancers were seen. Retroflexed views revealed no abnormalities. The time to cecum=2 minutes 17 seconds.  Withdrawal time=10 minutes 34 seconds.  The scope was withdrawn and the procedure completed.  COMPLICATIONS: There were no complications.  ENDOSCOPIC IMPRESSION: 1.  Normal  colon  RECOMMENDATIONS: 1.  Repeat Colonoscopy in 5 years.  eSigned:  Meryl Dare, MD, [facility] 04/05/2013 9:58 AM

## 2013-04-05 NOTE — Progress Notes (Signed)
No complaints noted in the recovery room. Maw   

## 2013-04-06 ENCOUNTER — Telehealth: Payer: Self-pay

## 2013-04-06 NOTE — Telephone Encounter (Signed)
Left a message on the pt answering machine at # [phone] for the pt to call if she has any questions or concerns. maw

## 2013-09-23 ENCOUNTER — Ambulatory Visit (INDEPENDENT_AMBULATORY_CARE_PROVIDER_SITE_OTHER): Payer: PRIVATE HEALTH INSURANCE | Admitting: Family Medicine

## 2013-09-23 ENCOUNTER — Encounter: Payer: Self-pay | Admitting: Family Medicine

## 2013-09-23 VITALS — BP 118/62 | HR 65 | Temp 98.1°F | Ht 65.75 in | Wt 157.8 lb

## 2013-09-23 DIAGNOSIS — Z01818 Encounter for other preprocedural examination: Secondary | ICD-10-CM

## 2013-09-23 NOTE — Progress Notes (Signed)
Pre visit review using our clinic review tool, if applicable. No additional management support is needed unless otherwise documented below in the visit note. 

## 2013-09-23 NOTE — Progress Notes (Signed)
Subjective:    [name] is a 43 y.o. female who presents to the office today for a preoperative consultation at the request of surgeon Albertine Patricia who plans on performing metarsal fracture repair on March 6. This consultation is requested for the specific conditions prompting preoperative evaluation (i.e. because of potential affect on operative risk):  The patient has the following known anesthesia issues: none. Patients bleeding risk: no recent abnormal bleeding.   Patient Active Problem List   Diagnosis Date Noted  . Heart palpitations 05/23/2011  . RECTAL BLEEDING 11/28/2009  . DIARRHEA, BLOODY 11/28/2009  . CHANGE IN BOWELS 11/28/2009  . NONSPEC ABN FINDNG RAD & OTH EXAM ABDOMINAL AREA 11/28/2009  . COLONIC POLYPS, ADENOMATOUS, HX OF 04/21/2008  . ABDOMINAL PAIN, GENERALIZED 03/22/2008   Past Medical History  Diagnosis Date  . Uterine polyp   . Colon polyps   . Hiatal hernia   . Chronic headache   . Cardiac murmur     As a child  . Esophageal stricture   . GERD (gastroesophageal reflux disease)    Past Surgical History  Procedure Laterality Date  . Polypectomy      uterine   History  Substance Use Topics  . Smoking status: Former Smoker    Types: Cigarettes  . 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  Marland Kitchen Alcohol Use: 1.2 oz/week    2 Glasses of wine per week   Family History  Problem Relation Age of Onset  . Diabetes Father   . Hyperlipidemia Father   . Hypertension Father   . Cancer Paternal Aunt 31    breast  . Breast cancer Paternal Aunt   . Ovarian cancer Maternal Aunt    Allergies  Allergen Reactions  . Eggs Or Egg-Derived Products     Causes abd pain and rash / can eat eggs mixed in food.   Current Outpatient Prescriptions on File Prior to Visit  Medication Sig Dispense Refill  . Multiple Vitamin (MULTIVITAMIN) tablet Take 1 tablet by mouth daily.       No current facility-administered medications on file  prior to visit.   The PMH, PSH, Social History, Family History, Medications, and allergies have been reviewed in [facility], and have been updated if relevant.   Review of Systems See HPI No CP or SOB   Objective:  BP 118/62  Pulse 65  Temp(Src) 98.1 F (36.7 C) (Oral)  Ht 5' 5.75" (1.67 m)  Wt 157 lb 12 oz (71.555 kg)  BMI 25.66 kg/m2  SpO2 98%  LMP 08/31/2013   BP 118/62  Pulse 65  Temp(Src) 98.1 F (36.7 C) (Oral)  Ht 5' 5.75" (1.67 m)  Wt 157 lb 12 oz (71.555 kg)  BMI 25.66 kg/m2  SpO2 98%  LMP 08/31/2013  General Appearance:    Alert, cooperative, no distress, appears stated age  Head:    Normocephalic, without obvious abnormality, atraumatic  Eyes:    PERRL, conjunctiva/corneas clear, EOM's intact, fundi    benign, both eyes  Ears:    Normal TM's and external ear canals, both ears  Nose:   Nares normal, septum midline, mucosa normal, no drainage    or sinus tenderness  Throat:   Lips, mucosa, and tongue normal; teeth and gums normal  Neck:   Supple, symmetrical, trachea midline, no adenopathy;    thyroid:  no enlargement/tenderness/nodules; no carotid   bruit or JVD  Back:     Symmetric, no curvature,  ROM normal, no CVA tenderness  Lungs:     Clear to auscultation bilaterally, respirations unlabored  Chest Wall:    No tenderness or deformity   Heart:    Regular rate and rhythm, S1 and S2 normal, no murmur, rub   or gallop  Extremities:   Extremities normal, atraumatic, no cyanosis or edema  Pulses:   2+ and symmetric all extremities  Skin:   Skin color, texture, turgor normal, no rashes or lesions  Lymph nodes:   Cervical, supraclavicular, and axillary nodes normal  Neurologic:   CNII-XII intact, normal strength, sensation and reflexes    throughout   Cardiographics ECG: normal sinus rhythm, no blocks or conduction defects, no ischemic changes    Assessment:      43 y.o. female with planned surgery as above.   Known risk factors for perioperative  complications: None   Difficulty with intubation is not anticipated.  Cardiac Risk Estimation: low     Plan:    1. Preoperative workup as follows ECG.

## 2013-11-04 ENCOUNTER — Telehealth: Payer: Self-pay

## 2013-11-04 NOTE — Telephone Encounter (Signed)
Pt left v/m; 10/30/13 pt had crushing chest and back pain with difficulty breathing; this occurred x 2 over weekend. Pt says she feels fine now but does still feel weak and has naggy tenderness in lt breast when breathes. Pt said she already has appt to see Webb Silversmith NP 11/05/13 at 1:15 PM. Dr Damita Dunnings recommends to keep appt on 11/05/13 but if condition changes or worsens go to ED/UC. Pt voiced understanding.

## 2013-11-05 ENCOUNTER — Ambulatory Visit (INDEPENDENT_AMBULATORY_CARE_PROVIDER_SITE_OTHER): Payer: PRIVATE HEALTH INSURANCE | Admitting: Internal Medicine

## 2013-11-05 ENCOUNTER — Encounter (INDEPENDENT_AMBULATORY_CARE_PROVIDER_SITE_OTHER): Payer: Self-pay

## 2013-11-05 ENCOUNTER — Encounter: Payer: Self-pay | Admitting: Internal Medicine

## 2013-11-05 VITALS — BP 100/58 | HR 71 | Temp 98.6°F | Wt 156.0 lb

## 2013-11-05 DIAGNOSIS — R002 Palpitations: Secondary | ICD-10-CM

## 2013-11-05 DIAGNOSIS — R079 Chest pain, unspecified: Secondary | ICD-10-CM

## 2013-11-05 DIAGNOSIS — R110 Nausea: Secondary | ICD-10-CM

## 2013-11-05 DIAGNOSIS — R0602 Shortness of breath: Secondary | ICD-10-CM

## 2013-11-05 LAB — TROPONIN I: Troponin I: <0.01 ng/mL (ref <0.06)

## 2013-11-05 LAB — COMPREHENSIVE METABOLIC PANEL
ALBUMIN: 3.9 g/dL (ref 3.5–5.2)
ALK PHOS: 75 U/L (ref 39–117)
ALT: 19 U/L (ref 0–35)
AST: 16 U/L (ref 0–37)
BILIRUBIN TOTAL: 0.3 mg/dL (ref 0.2–1.2)
BUN: 10 mg/dL (ref 6–23)
CO2: 27 mEq/L (ref 19–32)
Calcium: 9.1 mg/dL (ref 8.4–10.5)
Chloride: 105 mEq/L (ref 96–112)
Creat: 0.74 mg/dL (ref 0.50–1.10)
GLUCOSE: 93 mg/dL (ref 70–99)
POTASSIUM: 4.1 meq/L (ref 3.5–5.3)
SODIUM: 139 meq/L (ref 135–145)
Total Protein: 6.4 g/dL (ref 6.0–8.3)

## 2013-11-05 LAB — CREATININE KINASE MB

## 2013-11-05 LAB — CBC
HCT: 38.6 % (ref 36.0–46.0)
Hemoglobin: 13.1 g/dL (ref 12.0–15.0)
MCH: 32.2 pg (ref 26.0–34.0)
MCHC: 33.9 g/dL (ref 30.0–36.0)
MCV: 94.8 fL (ref 78.0–100.0)
Platelets: 323 10*3/uL (ref 150–400)
RBC: 4.07 MIL/uL (ref 3.87–5.11)
RDW: 12.9 % (ref 11.5–15.5)
WBC: 5.3 10*3/uL (ref 4.0–10.5)

## 2013-11-05 LAB — TSH: TSH: 1.203 u[IU]/mL (ref 0.350–4.500)

## 2013-11-05 NOTE — Progress Notes (Signed)
Subjective:    Patient ID: [name], female    DOB: Nov 19, 1970, 42 y.o.   MRN: [id number]  HPI  Pt presents to the clinic today with c/o chest discomfort and shortness of breath. She reports this occurred x 2 over the weekend. She described the pain as a crushing sensation that radiated to her back. She reports it also radiates to her neck. It occurred with sitting. She has had associated palpitations, dizziness, headaches and nausea. The pain has resolved, but she now feels week and has pain with deep breaths. She reports that she has a history of reflux but this is completely different. She does have a family history of heart disease and heart attack.   Review of Systems      Past Medical History  Diagnosis Date  . Uterine polyp   . Colon polyps   . Hiatal hernia   . Chronic headache   . Cardiac murmur     As a child  . Esophageal stricture   . GERD (gastroesophageal reflux disease)     Current Outpatient Prescriptions  Medication Sig Dispense Refill  . Multiple Vitamin (MULTIVITAMIN) tablet Take 1 tablet by mouth daily.       No current facility-administered medications for this visit.    Allergies  Allergen Reactions  . Eggs Or Egg-Derived Products     Causes abd pain and rash / can eat eggs mixed in food.    Family History  Problem Relation Age of Onset  . Diabetes Father   . Hyperlipidemia Father   . Hypertension Father   . Cancer Paternal Aunt 40    breast  . Breast cancer Paternal Aunt   . Ovarian cancer Maternal Aunt     History   Social History  . Marital Status: Married    Spouse Name: N/A    Number of Children: N/A  . Years of Education: N/A   Occupational History  . Not on file.   Social History Main Topics  . Smoking status: Former Smoker    Types: Cigarettes  . 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  Marland Kitchen Alcohol Use: 1.2 oz/week    2 Glasses of wine per week  . Drug Use: No  . Sexual Activity: Yes      Birth Control/ Protection: Condom, Rhythm   Other Topics Concern  . Not on file   Social History Narrative  . No narrative on file     Constitutional: Pt reports fatigue. Denies fever, malaise,  headache or abrupt weight changes.  Respiratory: Pt reports shortness of breath. Denies difficulty breathing, cough or sputum production.   Cardiovascular: Pt reports chest pain and palpitations. Denies chest tightness,  or swelling in the hands or feet.  Gastrointestinal: Pt reports nausea. Denies abdominal pain, bloating, constipation, diarrhea or blood in the stool.   Neurological: Pt reports dizziness. Denies difficulty with memory, difficulty with speech or problems with balance and coordination.   No other specific complaints in a complete review of systems (except as listed in HPI above).  Objective:   Physical Exam   BP 100/58  Pulse 71  Temp(Src) 98.6 F (37 C) (Tympanic)  Wt 156 lb (70.761 kg)  SpO2 99% Wt Readings from Last 3 Encounters:  11/05/13 156 lb (70.761 kg)  09/23/13 157 lb 12 oz (71.555 kg)  04/05/13 153 lb (69.4 kg)    General: Appears her stated age, well developed, well nourished in NAD. Cardiovascular:  irregular rhythm. S1,S2. Possible murmur noted.  No rubs or gallops noted. No JVD or BLE edema. No carotid bruits noted. Pulmonary/Chest: Normal effort and positive vesicular breath sounds. No respiratory distress. No wheezes, rales or ronchi noted.  Neurological: Alert and oriented.  Coordination normal. +DTRs bilaterally.   BMET    Component Value Date/Time   NA 140 05/23/2011 1157   K 3.9 05/23/2011 1157   CL 109 05/23/2011 1157   CO2 25 05/23/2011 1157   GLUCOSE 86 05/23/2011 1157   BUN 14 05/23/2011 1157   CREATININE 0.7 05/23/2011 1157   CALCIUM 9.1 05/23/2011 1157   GFRNONAA 96.96 11/28/2009 1044    Lipid Panel  No results found for this basename: chol, trig, hdl, cholhdl, vldl, ldlcalc    CBC    Component Value Date/Time   WBC 7.0  05/23/2011 1157   RBC 3.88 05/23/2011 1157   HGB 13.1 05/23/2011 1157   HCT 38.7 05/23/2011 1157   PLT 279.0 05/23/2011 1157   MCV 99.9 05/23/2011 1157   MCHC 33.9 05/23/2011 1157   RDW 12.9 05/23/2011 1157   LYMPHSABS 3.0 05/23/2011 1157   MONOABS 0.5 05/23/2011 1157   EOSABS 0.1 05/23/2011 1157   BASOSABS 0.0 05/23/2011 1157          Assessment & Plan:   Chest pain, nausea, fatigue, shortness of breath:  Will check ECG today: normal Stat labs: CBC, CMET, TSH and cardiac enzymes  If symptoms return, please go to the ER or call [phone] [email or web address]

## 2013-11-05 NOTE — Patient Instructions (Addendum)
Chest Pain (Nonspecific) °It is often hard to give a specific diagnosis for the cause of chest pain. There is always a chance that your pain could be related to something serious, such as a heart attack or a blood clot in the lungs. You need to follow up with your caregiver for further evaluation. °CAUSES  °· Heartburn. °· Pneumonia or bronchitis. °· Anxiety or stress. °· Inflammation around your heart (pericarditis) or lung (pleuritis or pleurisy). °· A blood clot in the lung. °· A collapsed lung (pneumothorax). It can develop suddenly on its own (spontaneous pneumothorax) or from injury (trauma) to the chest. °· Shingles infection (herpes zoster virus). °The chest wall is composed of bones, muscles, and cartilage. Any of these can be the source of the pain. °· The bones can be bruised by injury. °· The muscles or cartilage can be strained by coughing or overwork. °· The cartilage can be affected by inflammation and become sore (costochondritis). °DIAGNOSIS  °Lab tests or other studies, such as X-rays, electrocardiography, stress testing, or cardiac imaging, may be needed to find the cause of your pain.  °TREATMENT  °· Treatment depends on what may be causing your chest pain. Treatment may include: °· Acid blockers for heartburn. °· Anti-inflammatory medicine. °· Pain medicine for inflammatory conditions. °· Antibiotics if an infection is present. °· You may be advised to change lifestyle habits. This includes stopping smoking and avoiding alcohol, caffeine, and chocolate. °· You may be advised to keep your head raised (elevated) when sleeping. This reduces the chance of acid going backward from your stomach into your esophagus. °· Most of the time, nonspecific chest pain will improve within 2 to 3 days with rest and mild pain medicine. °HOME CARE INSTRUCTIONS  °· If antibiotics were prescribed, take your antibiotics as directed. Finish them even if you start to feel better. °· For the next few days, avoid physical  activities that bring on chest pain. Continue physical activities as directed. °· Do not smoke. °· Avoid drinking alcohol. °· Only take over-the-counter or prescription medicine for pain, discomfort, or fever as directed by your caregiver. °· Follow your caregiver's suggestions for further testing if your chest pain does not go away. °· Keep any follow-up appointments you made. If you do not go to an appointment, you could develop lasting (chronic) problems with pain. If there is any problem keeping an appointment, you must call to reschedule. °SEEK MEDICAL CARE IF:  °· You think you are having problems from the medicine you are taking. Read your medicine instructions carefully. °· Your chest pain does not go away, even after treatment. °· You develop a rash with blisters on your chest. °SEEK IMMEDIATE MEDICAL CARE IF:  °· You have increased chest pain or pain that spreads to your arm, neck, jaw, back, or abdomen. °· You develop shortness of breath, an increasing cough, or you are coughing up blood. °· You have severe back or abdominal pain, feel nauseous, or vomit. °· You develop severe weakness, fainting, or chills. °· You have a fever. °THIS IS AN EMERGENCY. Do not wait to see if the pain will go away. Get medical help at once. Call your local emergency services (911 in [place] Do not drive yourself to the hospital. °MAKE SURE YOU:  °· Understand these instructions. °· Will watch your condition. °· Will get help right away if you are not doing well or get worse. °Document Released: 04/17/2005 Document Revised: 09/30/2011 Document Reviewed: 02/11/2008 °ExitCare® Patient Information ©2014 [employer]   [employer]. ° °

## 2013-11-05 NOTE — Addendum Note (Signed)
Addended by: Ellamae Sia on: 11/05/2013 02:25 PM   Modules accepted: Orders

## 2013-11-05 NOTE — Progress Notes (Signed)
Pre visit review using our clinic review tool, if applicable. No additional management support is needed unless otherwise documented below in the visit note. 

## 2013-11-10 ENCOUNTER — Emergency Department (HOSPITAL_COMMUNITY)
Admission: EM | Admit: 2013-11-10 | Discharge: 2013-11-10 | Disposition: A | Discharge status: Home / Self Care | Payer: PRIVATE HEALTH INSURANCE | Attending: Emergency Medicine | Admitting: Emergency Medicine

## 2013-11-10 ENCOUNTER — Encounter (HOSPITAL_COMMUNITY): Payer: Self-pay | Admitting: Emergency Medicine

## 2013-11-10 ENCOUNTER — Emergency Department (HOSPITAL_COMMUNITY): Payer: PRIVATE HEALTH INSURANCE

## 2013-11-10 ENCOUNTER — Telehealth: Payer: Self-pay | Admitting: Family Medicine

## 2013-11-10 DIAGNOSIS — R0602 Shortness of breath: Secondary | ICD-10-CM | POA: Insufficient documentation

## 2013-11-10 DIAGNOSIS — Z860100 Personal history of colon polyps, unspecified: Secondary | ICD-10-CM | POA: Insufficient documentation

## 2013-11-10 DIAGNOSIS — Z87891 Personal history of nicotine dependence: Secondary | ICD-10-CM | POA: Insufficient documentation

## 2013-11-10 DIAGNOSIS — Z8719 Personal history of other diseases of the digestive system: Secondary | ICD-10-CM | POA: Insufficient documentation

## 2013-11-10 DIAGNOSIS — Z8742 Personal history of other diseases of the female genital tract: Secondary | ICD-10-CM | POA: Insufficient documentation

## 2013-11-10 DIAGNOSIS — Z8781 Personal history of (healed) traumatic fracture: Secondary | ICD-10-CM | POA: Insufficient documentation

## 2013-11-10 DIAGNOSIS — G8929 Other chronic pain: Secondary | ICD-10-CM | POA: Insufficient documentation

## 2013-11-10 DIAGNOSIS — R079 Chest pain, unspecified: Secondary | ICD-10-CM | POA: Insufficient documentation

## 2013-11-10 DIAGNOSIS — R011 Cardiac murmur, unspecified: Secondary | ICD-10-CM | POA: Insufficient documentation

## 2013-11-10 HISTORY — DX: Unspecified fracture of unspecified foot, initial encounter for closed fracture: S92.909A

## 2013-11-10 LAB — BASIC METABOLIC PANEL
BUN: 12 mg/dL (ref 6–23)
CHLORIDE: 102 meq/L (ref 96–112)
CO2: 24 meq/L (ref 19–32)
Calcium: 9.3 mg/dL (ref 8.4–10.5)
Creatinine, Ser: 0.74 mg/dL (ref 0.50–1.10)
GFR calc Af Amer: >90 mL/min (ref >90)
GFR calc non Af Amer: >90 mL/min (ref >90)
GLUCOSE: 76 mg/dL (ref 70–99)
POTASSIUM: 3.9 meq/L (ref 3.7–5.3)
SODIUM: 139 meq/L (ref 137–147)

## 2013-11-10 LAB — CBC
HEMATOCRIT: 41.3 % (ref 36.0–46.0)
HEMOGLOBIN: 14.2 g/dL (ref 12.0–15.0)
MCH: 33.1 pg (ref 26.0–34.0)
MCHC: 34.4 g/dL (ref 30.0–36.0)
MCV: 96.3 fL (ref 78.0–100.0)
Platelets: 289 10*3/uL (ref 150–400)
RBC: 4.29 MIL/uL (ref 3.87–5.11)
RDW: 11.9 % (ref 11.5–15.5)
WBC: 5.7 10*3/uL (ref 4.0–10.5)

## 2013-11-10 LAB — I-STAT TROPONIN, ED
TROPONIN I, POC: 0 ng/mL (ref 0.00–0.08)
Troponin i, poc: 0 ng/mL (ref 0.00–0.08)

## 2013-11-10 LAB — LIPASE, BLOOD: LIPASE: 50 U/L (ref 11–59)

## 2013-11-10 LAB — D-DIMER, QUANTITATIVE (NOT AT ARMC)

## 2013-11-10 MED ORDER — MORPHINE SULFATE 4 MG/ML IJ SOLN
4.0000 mg | Freq: Once | INTRAMUSCULAR | Status: AC
  Administered 2013-11-10: 4 mg via INTRAVENOUS
  Filled 2013-11-10: qty 1

## 2013-11-10 MED ORDER — GI COCKTAIL ~~LOC~~
30.0000 mL | Freq: Once | ORAL | Status: DC
  Filled 2013-11-10: qty 30

## 2013-11-10 MED ORDER — ASPIRIN 81 MG PO CHEW
324.0000 mg | CHEWABLE_TABLET | Freq: Once | ORAL | Status: AC
  Administered 2013-11-10: 324 mg via ORAL
  Filled 2013-11-10: qty 4

## 2013-11-10 MED ORDER — NITROGLYCERIN 0.4 MG SL SUBL
0.4000 mg | SUBLINGUAL_TABLET | SUBLINGUAL | Status: DC | PRN
  Filled 2013-11-10: qty 1

## 2013-11-10 MED ORDER — SODIUM CHLORIDE 0.9 % IV BOLUS (SEPSIS)
1000.0000 mL | Freq: Once | INTRAVENOUS | Status: AC
  Administered 2013-11-10: 1000 mL via INTRAVENOUS

## 2013-11-10 NOTE — ED Provider Notes (Signed)
CSN: [id number]     Arrival date & time 11/10/13  0919 History   First MD Initiated Contact with Patient 11/10/13 [phone]     Chief Complaint  Patient presents with  . Chest Pain     (Consider location/radiation/quality/duration/timing/severity/associated sxs/prior Treatment) HPI Comments: Patient presents emergency department with chief complaint of chest pain. She states that she has had the pain for the past week. She states that his been intermittent in severity. She states that she was seen by her PCP on Friday, where she had an EKG, and labs which were normal. She states that she was released to go home, but then this morning she was awakened from sleep with pain in her chest that radiated to her jaw and neck. She states the pain radiates to the back. She reports feeling like she is having palpitations. She reports associated shortness of breath, but no diaphoresis. Do not have any cardiac risk factors. There no aggravating or alleviating factors.  The history is provided by the patient. No language interpreter was used.    Past Medical History  Diagnosis Date  . Uterine polyp   . Colon polyps   . Hiatal hernia   . Chronic headache   . Cardiac murmur     As a child  . Esophageal stricture   . GERD (gastroesophageal reflux disease)   . Foot fracture    Past Surgical History  Procedure Laterality Date  . Polypectomy      uterine   Family History  Problem Relation Age of Onset  . Diabetes Father   . Hyperlipidemia Father   . Hypertension Father   . Cancer Paternal Aunt 1    breast  . Breast cancer Paternal Aunt   . Ovarian cancer Maternal Aunt    History  Substance Use Topics  . Smoking status: Former Smoker    Types: Cigarettes  . 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  Marland Kitchen Alcohol Use: 1.2 oz/week    2 Glasses of wine per week   OB History   Grav Para Term Preterm Abortions TAB SAB Ect Mult Living   2 2        2      Review of  Systems  Constitutional: Negative for fever and chills.  Respiratory: Positive for shortness of breath.   Cardiovascular: Positive for chest pain.  Gastrointestinal: Negative for nausea, vomiting, diarrhea and constipation.  Genitourinary: Negative for dysuria.      Allergies  Eggs or egg-derived products  Home Medications   Prior to Admission medications   Medication Sig Start Date End Date Taking? Authorizing Provider  Multiple Vitamin (MULTIVITAMIN) tablet Take 1 tablet by mouth daily.    Historical Provider, MD   There were no vitals taken for this visit. Physical Exam  Nursing note and vitals reviewed. Constitutional: She is oriented to person, place, and time. She appears well-developed and well-nourished.  HENT:  Head: Normocephalic and atraumatic.  Eyes: Conjunctivae and EOM are normal. Pupils are equal, round, and reactive to light.  Neck: Normal range of motion. Neck supple.  Cardiovascular: Normal rate and regular rhythm.  Exam reveals no gallop and no friction rub.   No murmur heard. Pulmonary/Chest: Effort normal and breath sounds normal. No respiratory distress. She has no wheezes. She has no rales. She exhibits no tenderness.  Abdominal: Soft. Bowel sounds are normal. She exhibits no distension and no mass. There is no tenderness. There is no rebound and no  guarding.  Some epigastric tenderness, otherwise no focal abdominal tenderness  Musculoskeletal: Normal range of motion. She exhibits no edema and no tenderness.  Neurological: She is alert and oriented to person, place, and time.  Skin: Skin is warm and dry.  Psychiatric: She has a normal mood and affect. Her behavior is normal. Judgment and thought content normal.    ED Course  Procedures (including critical care time) Results for orders placed during the hospital encounter of 11/10/13  CBC      Result Value Ref Range   WBC 5.7  4.0 - 10.5 K/uL   RBC 4.29  3.87 - 5.11 MIL/uL   Hemoglobin 14.2  12.0 -  15.0 g/dL   HCT 41.3  36.0 - 46.0 %   MCV 96.3  78.0 - 100.0 fL   MCH 33.1  26.0 - 34.0 pg   MCHC 34.4  30.0 - 36.0 g/dL   RDW 11.9  11.5 - 15.5 %   Platelets 289  150 - 400 K/uL  BASIC METABOLIC PANEL      Result Value Ref Range   Sodium 139  137 - 147 mEq/L   Potassium 3.9  3.7 - 5.3 mEq/L   Chloride 102  96 - 112 mEq/L   CO2 24  19 - 32 mEq/L   Glucose, Bld 76  70 - 99 mg/dL   BUN 12  6 - 23 mg/dL   Creatinine, Ser 0.74  0.50 - 1.10 mg/dL   Calcium 9.3  8.4 - 10.5 mg/dL   GFR calc non Af Amer >90  >90 mL/min   GFR calc Af Amer >90  >90 mL/min  LIPASE, BLOOD      Result Value Ref Range   Lipase 50  11 - 59 U/L  I-STAT TROPOININ, ED      Result Value Ref Range   Troponin i, poc 0.00  0.00 - 0.08 ng/mL   Comment 3           I-STAT TROPOININ, ED      Result Value Ref Range   Troponin i, poc 0.00  0.00 - 0.08 ng/mL   Comment 3            Dg Chest 2 View  11/10/2013   CLINICAL DATA:  Chest pain  EXAM: CHEST  2 VIEW  COMPARISON:  None.  FINDINGS: Cardiomediastinal silhouette is unremarkable. Minimal thoracic dextroscoliosis. No acute infiltrate or pleural effusion. No pulmonary edema.  IMPRESSION: No active cardiopulmonary disease.   Electronically Signed   By: Lahoma Crocker M.D.   On: 11/10/2013 10:49      EKG Interpretation None      MDM   Final diagnoses:  None    Patient with chest pain that radiates to the back and jaw. Will check labs. Will check bilateral upper extremity blood pressures. Will treat pain, will reassess.  3:50 PM Patient reassessed x3.  Patient seen by and discussed with Dr. Tawnya Crook, who recommends cardiology consult.    I spoke with the cardiology PA, who will add the patient to the list.  4:00 Patient signed out to Dr. Venora Maples.  Plan:  Dispo per cardiology.  Montine Circle, PA-C 11/10/13 1552

## 2013-11-10 NOTE — ED Notes (Signed)
Rob Browning, PA at bedside  

## 2013-11-10 NOTE — ED Notes (Signed)
Patient offered food, deferred.

## 2013-11-10 NOTE — ED Notes (Signed)
Pt ambulated to bathroom 

## 2013-11-10 NOTE — ED Notes (Signed)
Pt in the bathroom at this time; "had to go first"; RN notified

## 2013-11-10 NOTE — ED Provider Notes (Signed)
Medical screening examination/treatment/procedure(s) were conducted as a shared visit with non-physician practitioner(s) and myself.  I personally evaluated the patient during the encounter.   EKG Interpretation   Date/Time:  Wednesday November 10 2013 11:10:28 EDT Ventricular Rate:  60 PR Interval:  102 QRS Duration: 88 QT Interval:  413 QTC Calculation: 413 R Axis:   75 Text Interpretation:  Sinus rhythm Short PR interval Confirmed by DOCHERTY   MD, MEGAN ([id number]) on 11/10/2013 3:02:29 PM      Well appearing. Please see cardiology consultation note. Dc home with outpatient follow up. Added d dimer, negative  Hoy Morn, MD 11/10/13 [phone]

## 2013-11-10 NOTE — ED Notes (Signed)
shes been having cp since last week. She went to her doctor on Friday for the pain and they did "an ekg and labs that were normal." she woke today and pain had returned in her jaw, neck and back and she also felt like she was having palpitations.

## 2013-11-10 NOTE — Consult Note (Signed)
Chief Complaint: Chest Pain  Primary Cardiologist: Dr. Rockey Situ   HPI: The patient is a 43 y/o female with no cardiac risk factors and no family h/o CAD, who presents to the [facility] ER with a complaint of intermittent CP and SOB x 1 week. She was seen by Dr. Rockey Situ in [place] in 2012 for evaluation for palpitations. She was monitored with a Holter monitor, which was unremarkable.   She notes substernal chest dull/ achy pain with radiation to the left breast, back, neck and jaw. She also notes mild dyspsnea. It is mildly pleuritic. No change with exertion, palpation of chest wall, or position. She notes recent history of left LEE trauma. She broke her left foot 8 weeks ago after a mechanical fall. She subsequently had to spend ~7 weeks in a boot. She also has h/o recent prolonged travel. She traveled to and back from [place] bus. She denies LEE pain, swelling and erythema. She denies past h/o of blood clots. No h/o of tobacco use. She denies oral contraceptive use.   Troponin is negative x 2. EKG and CXR both unremarkable.     Past Medical History   Diagnosis  Date   .  Uterine polyp    .  Colon polyps    .  Hiatal hernia    .  Chronic headache    .  Cardiac murmur      As a child   .  Esophageal stricture    .  GERD (gastroesophageal reflux disease)    .  Foot fracture     Past Surgical History   Procedure  Laterality  Date   .  Polypectomy       uterine    Family History   Problem  Relation  Age of Onset   .  Diabetes  Father    .  Hyperlipidemia  Father    .  Hypertension  Father    .  Cancer  Paternal Aunt  19     breast   .  Breast cancer  Paternal Aunt    .  Ovarian cancer  Maternal Aunt     Social History: reports that she has quit smoking. Her smoking use included Cigarettes. She smoked 0.00 packs per day. She has never used smokeless tobacco. She reports that she drinks about 1.2 ounces of alcohol per week. She reports that she does not use illicit drugs.  Allergies:    Allergies   Allergen  Reactions   .  Eggs Or Egg-Derived Products      Causes abd pain and rash / can eat eggs mixed in food.     (Not in a hospital admission)  Results for orders placed during the hospital encounter of 11/10/13 (from the past 48 hour(s))   CBC Status: None    Collection Time    11/10/13 9:53 AM   Result  Value  Ref Range    WBC  5.7  4.0 - 10.5 K/uL    RBC  4.29  3.87 - 5.11 MIL/uL    Hemoglobin  14.2  12.0 - 15.0 g/dL    HCT  41.3  36.0 - 46.0 %    MCV  96.3  78.0 - 100.0 fL    MCH  33.1  26.0 - 34.0 pg    MCHC  34.4  30.0 - 36.0 g/dL    RDW  11.9  11.5 - 15.5 %    Platelets  289  150 - 400 K/uL   BASIC  METABOLIC PANEL Status: None    Collection Time    11/10/13 9:53 AM   Result  Value  Ref Range    Sodium  139  137 - 147 mEq/L    Potassium  3.9  3.7 - 5.3 mEq/L    Chloride  102  96 - 112 mEq/L    CO2  24  19 - 32 mEq/L    Glucose, Bld  76  70 - 99 mg/dL    BUN  12  6 - 23 mg/dL    Creatinine, Ser  0.74  0.50 - 1.10 mg/dL    Calcium  9.3  8.4 - 10.5 mg/dL    GFR calc non Af Amer  >90  >90 mL/min    GFR calc Af Amer  >90  >90 mL/min    Comment:  (NOTE)     The eGFR has been calculated using the CKD EPI equation.     This calculation has not been validated in all clinical situations.     eGFR's persistently <90 mL/min signify possible Chronic Kidney     Disease.   LIPASE, BLOOD Status: None    Collection Time    11/10/13 9:53 AM   Result  Value  Ref Range    Lipase  50  11 - 59 U/L   I-STAT TROPOININ, ED Status: None    Collection Time    11/10/13 10:02 AM   Result  Value  Ref Range    Troponin i, poc  0.00  0.00 - 0.08 ng/mL    Comment 3      Comment:  Due to the release kinetics of cTnI,     a negative result within the first hours     of the onset of symptoms does not rule out     myocardial infarction with certainty.     If myocardial infarction is still suspected,     repeat the test at appropriate intervals.   Randolm Idol, ED  Status: None    Collection Time    11/10/13 3:41 PM   Result  Value  Ref Range    Troponin i, poc  0.00  0.00 - 0.08 ng/mL    Comment 3      Comment:  Due to the release kinetics of cTnI,     a negative result within the first hours     of the onset of symptoms does not rule out     myocardial infarction with certainty.     If myocardial infarction is still suspected,     repeat the test at appropriate intervals.    Dg Chest 2 View  11/10/2013 CLINICAL DATA: Chest pain EXAM: CHEST 2 VIEW COMPARISON: None. FINDINGS: Cardiomediastinal silhouette is unremarkable. Minimal thoracic dextroscoliosis. No acute infiltrate or pleural effusion. No pulmonary edema. IMPRESSION: No active cardiopulmonary disease. Electronically Signed By: Lahoma Crocker M.D. On: 11/10/2013 10:49   Review of Systems  Respiratory: Positive for shortness of breath.  Cardiovascular: Positive for chest pain. Negative for palpitations and leg swelling.  All other systems reviewed and are negative.   Blood pressure 94/62, pulse 62, temperature 98.2 F (36.8 C), temperature source Oral, resp. rate 15, last menstrual period 10/29/2013, SpO2 99.00%.  Physical Exam  Constitutional: She is oriented to person, place, and time. She appears well-developed and well-nourished. No distress.  Neck: No JVD present. Carotid bruit is not present.  Cardiovascular: Normal rate, regular rhythm, normal heart sounds and intact distal pulses. Exam reveals no gallop and  no friction rub.  No murmur heard.  Pulses:  Radial pulses are 2+ on the right side, and 2+ on the left side.  Dorsalis pedis pulses are 2+ on the right side, and 2+ on the left side.  Respiratory: Breath sounds normal. No respiratory distress. She has no wheezes. She has no rales. She exhibits no tenderness.  GI: Soft. Bowel sounds are normal. She exhibits no distension. There is no tenderness.  Musculoskeletal: She exhibits no edema.  Neurological: She is alert and oriented to  person, place, and time.  Skin: Skin is warm and dry. She is not diaphoretic.  Psychiatric: She has a normal mood and affect. Her behavior is normal.   Assessment/Plan  Active Problems:  Chest pain   Plan:  1. Chest Pain: Cardiac enzymes negative x 2. EKG and CXR unremarkable. No cardiac risk factors. This is likely noncardiac. With recent h/o of LEE trauma and recent prolonged travel, recommend obtaining a D-dimer or CT of chest to rule out PE.  Brittainy Simmons   11/10/2013, 5:30 PM  Patient seen with NP, agree with the above note.  Very atypical chest pain for cardiac causes.  ECG and CXR ok. Cardiac enzymes negative x 2.  Pain lasts about 1 minute at a time and comes on at random times, has been occurring for the last week.  As she took a long car trip prior to the onset of the chest pain episodes, reasonable to check D dimer.  If negative, from a cardiac standpoint she could go home with followup with Dr. Rockey Situ if episodes continue.   Larey Dresser 11/10/2013 5:47 PM

## 2013-11-10 NOTE — Discharge Instructions (Signed)
Chest Pain (Nonspecific) °It is often hard to give a specific diagnosis for the cause of chest pain. There is always a chance that your pain could be related to something serious, such as a heart attack or a blood clot in the lungs. You need to follow up with your caregiver for further evaluation. °CAUSES  °· Heartburn. °· Pneumonia or bronchitis. °· Anxiety or stress. °· Inflammation around your heart (pericarditis) or lung (pleuritis or pleurisy). °· A blood clot in the lung. °· A collapsed lung (pneumothorax). It can develop suddenly on its own (spontaneous pneumothorax) or from injury (trauma) to the chest. °· Shingles infection (herpes zoster virus). °The chest wall is composed of bones, muscles, and cartilage. Any of these can be the source of the pain. °· The bones can be bruised by injury. °· The muscles or cartilage can be strained by coughing or overwork. °· The cartilage can be affected by inflammation and become sore (costochondritis). °DIAGNOSIS  °Lab tests or other studies, such as X-rays, electrocardiography, stress testing, or cardiac imaging, may be needed to find the cause of your pain.  °TREATMENT  °· Treatment depends on what may be causing your chest pain. Treatment may include: °· Acid blockers for heartburn. °· Anti-inflammatory medicine. °· Pain medicine for inflammatory conditions. °· Antibiotics if an infection is present. °· You may be advised to change lifestyle habits. This includes stopping smoking and avoiding alcohol, caffeine, and chocolate. °· You may be advised to keep your head raised (elevated) when sleeping. This reduces the chance of acid going backward from your stomach into your esophagus. °· Most of the time, nonspecific chest pain will improve within 2 to 3 days with rest and mild pain medicine. °HOME CARE INSTRUCTIONS  °· If antibiotics were prescribed, take your antibiotics as directed. Finish them even if you start to feel better. °· For the next few days, avoid physical  activities that bring on chest pain. Continue physical activities as directed. °· Do not smoke. °· Avoid drinking alcohol. °· Only take over-the-counter or prescription medicine for pain, discomfort, or fever as directed by your caregiver. °· Follow your caregiver's suggestions for further testing if your chest pain does not go away. °· Keep any follow-up appointments you made. If you do not go to an appointment, you could develop lasting (chronic) problems with pain. If there is any problem keeping an appointment, you must call to reschedule. °SEEK MEDICAL CARE IF:  °· You think you are having problems from the medicine you are taking. Read your medicine instructions carefully. °· Your chest pain does not go away, even after treatment. °· You develop a rash with blisters on your chest. °SEEK IMMEDIATE MEDICAL CARE IF:  °· You have increased chest pain or pain that spreads to your arm, neck, jaw, back, or abdomen. °· You develop shortness of breath, an increasing cough, or you are coughing up blood. °· You have severe back or abdominal pain, feel nauseous, or vomit. °· You develop severe weakness, fainting, or chills. °· You have a fever. °THIS IS AN EMERGENCY. Do not wait to see if the pain will go away. Get medical help at once. Call your local emergency services (911 in [place] Do not drive yourself to the hospital. °MAKE SURE YOU:  °· Understand these instructions. °· Will watch your condition. °· Will get help right away if you are not doing well or get worse. °Document Released: 04/17/2005 Document Revised: 09/30/2011 Document Reviewed: 02/11/2008 °ExitCare® Patient Information ©2014 [employer]   LL[employer]. ° °

## 2013-11-10 NOTE — Telephone Encounter (Signed)
Patient Information:  Caller Name: Shimika  Phone: [phone]  Patient: [name], [name]  Gender: Female  DOB: [birth year]  Age: 43 Years  PCP: Arnette Norris [facility])  Pregnant: No  Office Follow Up:  Does the office need to follow up with this patient?: No  Instructions For The Office: N/A  RN Note:  RN called the office with ED dispostion (per their protocol). [name] advised ED. Pt advised to go to [facility] ED but [facility] is closer to her [name] may go there.  Symptoms  Reason For Call & Symptoms: Pt awoke with "crushing" chest pain . Onset this am at 5:30-6:30. She also had jaw pain and back of the neck/upper back. Pt had similar symptoms on 11/04/13 and was seen at the office on 11/05/13. EKG and labs were normal at that time. Pt states that the "aching/tenderness" to the left breast  has been ongoing since then. Right now her symptoms have reduced to an "ache" in her left breast  Reviewed Health History In EMR: Yes  Reviewed Medications In EMR: Yes  Reviewed Allergies In EMR: Yes  Reviewed Surgeries / Procedures: Yes  Date of Onset of Symptoms: 11/10/2013 OB / GYN:  LMP: 10/29/2013  Guideline(s) Used:  Chest Pain  Disposition Per Guideline:   Go to ED Now  Reason For Disposition Reached:   Hip or leg fracture in past 2 months (e.g, or had cast on leg or ankle)  Advice Given:  N/A  Patient Will Follow Care Advice:  YES

## 2014-05-09 ENCOUNTER — Other Ambulatory Visit: Payer: Self-pay

## 2014-05-09 DIAGNOSIS — Z1231 Encounter for screening mammogram for malignant neoplasm of breast: Secondary | ICD-10-CM

## 2014-05-23 ENCOUNTER — Encounter (HOSPITAL_COMMUNITY): Payer: Self-pay | Admitting: Emergency Medicine

## 2014-05-26 ENCOUNTER — Ambulatory Visit
Admission: RE | Admit: 2014-05-26 | Discharge: 2014-05-26 | Disposition: A | Discharge status: Home / Self Care | Payer: No Typology Code available for payment source | Source: Ambulatory Visit

## 2014-05-26 DIAGNOSIS — Z1231 Encounter for screening mammogram for malignant neoplasm of breast: Secondary | ICD-10-CM

## 2015-06-05 ENCOUNTER — Encounter: Payer: Self-pay | Admitting: Gastroenterology

## 2016-03-21 ENCOUNTER — Encounter: Payer: Self-pay | Admitting: Family Medicine

## 2016-04-15 ENCOUNTER — Encounter: Payer: Self-pay | Admitting: Family Medicine

## 2016-04-15 ENCOUNTER — Ambulatory Visit (INDEPENDENT_AMBULATORY_CARE_PROVIDER_SITE_OTHER): Payer: 59 | Admitting: Family Medicine

## 2016-04-15 VITALS — BP 120/80 | HR 67 | Wt 157.0 lb

## 2016-04-15 DIAGNOSIS — Z23 Encounter for immunization: Secondary | ICD-10-CM | POA: Diagnosis not present

## 2016-04-15 DIAGNOSIS — Z0289 Encounter for other administrative examinations: Secondary | ICD-10-CM

## 2016-04-15 DIAGNOSIS — Z111 Encounter for screening for respiratory tuberculosis: Secondary | ICD-10-CM

## 2016-04-15 NOTE — Addendum Note (Signed)
Addended by: Inocencio Homes on: 04/15/2016 08:45 AM   Modules accepted: Orders

## 2016-04-15 NOTE — Progress Notes (Signed)
Subjective:   Patient ID: [name], female    DOB: [birth year], 45 y.o.   MRN: [id number]  [name] is a pleasant 45 y.o. year old female who presents to clinic today with fill out paper work  on 04/15/2016  HPI:  Brings in forms to fill out for work. 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  Also asking for her to have  TB skin test.  Current Outpatient Prescriptions on File Prior to Visit  Medication Sig Dispense Refill  . aspirin 325 MG tablet Take 325 mg by mouth daily.    . Multiple Vitamin (MULTIVITAMIN) tablet Take 1 tablet by mouth daily.     No current facility-administered medications on file prior to visit.     Allergies  Allergen Reactions  . Eggs Or Egg-Derived Products     Causes abd pain and rash / can eat eggs mixed in food.    Past Medical History:  Diagnosis Date  . Cardiac murmur    As a child  . Chronic headache   . Colon polyps   . Esophageal stricture   . Foot fracture   . GERD (gastroesophageal reflux disease)   . Hiatal hernia   . Uterine polyp     Past Surgical History:  Procedure Laterality Date  . POLYPECTOMY     uterine    Family History  Problem Relation Age of Onset  . Diabetes Father   . Hyperlipidemia Father   . Hypertension Father   . Cancer Paternal Aunt 77    breast  . Breast cancer Paternal Aunt   . Ovarian cancer Maternal Aunt     Social History   Social History  . Marital status: Married    Spouse name: N/A  . Number of children: N/A  . Years of education: N/A   Occupational History  . Not on file.   Social History Main Topics  . Smoking status: Former Smoker    Types: Cigarettes  . 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  Marland Kitchen Alcohol use 1.2 oz/week    2 Glasses of wine per week  . Drug use: No  . Sexual activity: Yes    Birth control/ protection: Condom, Rhythm   Other Topics Concern  . Not on file    Social History Narrative  . No narrative on file   The PMH, PSH, Social History, Family History, Medications, and allergies have been reviewed in [facility], and have been updated if relevant.   Review of Systems  Constitutional: Negative.   Respiratory: Negative.   Cardiovascular: Negative.   Gastrointestinal: Negative.   Genitourinary: Negative.   Musculoskeletal: Negative.   Neurological: Negative.   Hematological: Negative.   All other systems reviewed and are negative.      Objective:    BP 120/80   Pulse 67   Wt 157 lb (71.2 kg)   SpO2 98%   BMI 25.53 kg/m    Physical Exam  Constitutional: She is oriented to person, place, and time. She appears well-developed and well-nourished. No distress.  HENT:  Head: Normocephalic and atraumatic.  Eyes: Conjunctivae are normal.  Cardiovascular: Normal rate and regular rhythm.   Musculoskeletal: Normal range of motion.  Neurological: She is alert and oriented to person, place, and time. No cranial nerve deficit.  Skin: Skin is warm and dry. She is not diaphoretic.  Psychiatric: She has a normal mood  and affect. Her behavior is normal. Judgment and thought content normal.  Nursing note and vitals reviewed.         Assessment & Plan:   Encounter for completion of form with patient No Follow-up on file.

## 2016-04-15 NOTE — Addendum Note (Signed)
Addended by: Inocencio Homes on: 04/15/2016 09:28 AM   Modules accepted: Orders

## 2016-04-15 NOTE — Assessment & Plan Note (Signed)
TB skin test and influenza vaccine given to patient. Exam performed. She will return to have TB test read and to have form signed at that time. The patient indicates understanding of these issues and agrees with the plan.

## 2016-04-17 ENCOUNTER — Telehealth: Payer: Self-pay | Admitting: Family Medicine

## 2016-04-17 LAB — TB SKIN TEST
Induration: 0 mm
TB Skin Test: NEGATIVE

## 2016-04-17 NOTE — Telephone Encounter (Signed)
Pt dropped off health exam form for signature. I placed in Rx tower.

## 2016-04-18 NOTE — Telephone Encounter (Signed)
Form placed in Dr Hulen Shouts inbox for review and completion

## 2017-07-23 ENCOUNTER — Other Ambulatory Visit: Payer: Self-pay | Admitting: Radiology

## 2017-12-16 ENCOUNTER — Encounter: Payer: Self-pay | Admitting: Nurse Practitioner

## 2017-12-31 ENCOUNTER — Encounter: Payer: Self-pay | Admitting: Nurse Practitioner

## 2017-12-31 ENCOUNTER — Ambulatory Visit (INDEPENDENT_AMBULATORY_CARE_PROVIDER_SITE_OTHER): Payer: 59 | Admitting: Nurse Practitioner

## 2017-12-31 VITALS — BP 110/68 | HR 76 | Ht 67.0 in | Wt 151.2 lb

## 2017-12-31 DIAGNOSIS — R1310 Dysphagia, unspecified: Secondary | ICD-10-CM

## 2017-12-31 DIAGNOSIS — Z86010 Personal history of colonic polyps: Secondary | ICD-10-CM | POA: Diagnosis not present

## 2017-12-31 NOTE — Patient Instructions (Signed)
If you are age 48 or older, your body mass index should be between 23-30. Your Body mass index is 23.69 kg/m. If this is out of the aforementioned range listed, please consider follow up with your Primary Care Provider.  If you are age 74 or younger, your body mass index should be between 19-25. Your Body mass index is 23.69 kg/m. If this is out of the aformentioned range listed, please consider follow up with your Primary Care Provider.   You have been scheduled for an endoscopy. Please follow written instructions given to you at your visit today. If you use inhalers (even only as needed), please bring them with you on the day of your procedure. Your physician has requested that you go to www.startemmi.com and enter the access code given to you at your visit today. This web site gives a general overview about your procedure. However, you should still follow specific instructions given to you by our office regarding your preparation for the procedure.  Contact the office to schedule colonoscopy in September.  Thank you for choosing me and [facility].   Tonya Savoy, NP

## 2017-12-31 NOTE — Progress Notes (Signed)
Chief Complaint: dysphagia  Referring Provider:   self     ASSESSMENT AND PLAN;   82. 47 yo female with hx of a distal esophageal stricture, s/p Savary dilation in 2009. Now with intermittent solid food dysphagia over last few months. Having at least two episodes a week.  -Given her history of esophageal stricture will proceed with repeat EGD with probable dilation. The risks and benefits of EGD were discussed and the patient agrees to proceed.  -While awaiting EGD, advised patient to eat small bites, chew well with liquids in between bites to avoid food impaction.  2. Hx of adenomatous colon polyps, no polyps on surveillance colonoscopy in Sept 2009, due for surveillance in Sept 2019.  -The risks and benefits of colonoscopy with possible polypectomy were discussed and the patient agrees to proceed.  -Initially we discussed having her colonoscopy at the same time as EGD.  Endoscopy schedule unable to accommodate double procedure any time before late August and patient out of town at that time anyway.  Therefore,  I recommended she go ahead with the EGD with dilation and get colonoscopy done in September.   HPI:     [name] is a 47 year old female known to Dr. Fuller Plan.  She has a history of adenomatous colon polyps.  She is due for surveillance colonoscopy in September of this year.  No bowel changes nor blood in stool.  Mildly comes in today to discuss swallowing problems.   Ten years ago she underwent upper endoscopy with dilation of a distal esophageal stricture.  No further problems swallowing until just the last few months. If not paying attention she may have episodes of solid food dysphagia a couple of times a week. Meats and bread are definite culprits. No heartburn or regurgitation. Her weight is stable.  No other upper GI complaints such as nausea, vomiting.  No abdominal pain.    Past Medical History:  Diagnosis Date  . Cardiac murmur    As a child  . Chronic headache   . Colon  polyps   . Esophageal stricture   . Foot fracture   . GERD (gastroesophageal reflux disease)   . Hiatal hernia   . Uterine polyp     Past Surgical History:  Procedure Laterality Date  . POLYPECTOMY     uterine   Family History  Problem Relation Age of Onset  . Diabetes Father   . Hyperlipidemia Father   . Hypertension Father   . Cancer Paternal Aunt 33       breast  . Breast cancer Paternal Aunt   . Ovarian cancer Maternal Aunt    Social History   Tobacco Use  . Smoking status: Former Smoker    Types: Cigarettes  . Smokeless tobacco: Never Used  . Tobacco comment: [place].  Substance Use Topics  . Alcohol use: Yes    Alcohol/week: 1.2 oz    Types: 2 Glasses of wine per week  . Drug use: No   Current Outpatient Medications  Medication Sig Dispense Refill  . Multiple Vitamin (MULTIVITAMIN) tablet Take 1 tablet by mouth daily.     No current facility-administered medications for this visit.    Allergies  Allergen Reactions  . Eggs Or Egg-Derived Products     Causes abd pain and rash / can eat eggs mixed in food.     Review of Systems: All systems reviewed and negative except where noted in HPI.   Creatinine clearance cannot be calculated (  Patient's most recent lab result is older than the maximum 21 days allowed.)   Physical Exam:    Wt Readings from Last 3 Encounters:  12/31/17 151 lb 4 oz (68.6 kg)  04/15/16 157 lb (71.2 kg)  11/05/13 156 lb (70.8 kg)    BP 110/68   Pulse 76   Ht 5\' 7"  (1.702 m)   Wt 151 lb 4 oz (68.6 kg)   BMI 23.69 kg/m  Constitutional:  Pleasant female in no acute distress. Psychiatric: Normal mood and affect. Behavior is normal. EENT: Pupils normal.  Conjunctivae are normal. No scleral icterus. Neck supple.  Cardiovascular: Normal rate, regular rhythm. No edema Pulmonary/chest: Effort normal and breath sounds normal. No wheezing, rales or rhonchi. Abdominal: Soft, nondistended, nontender. Bowel sounds  active throughout. There are no masses palpable. No hepatomegaly. Neurological: Alert and oriented to person place and time. Skin: Skin is warm and dry. No rashes noted.  Tye Savoy, NP  12/31/2017, 2:42 PM

## 2018-01-02 ENCOUNTER — Encounter: Payer: Self-pay | Admitting: Nurse Practitioner

## 2018-01-04 NOTE — Progress Notes (Signed)
Reviewed and agree with management plan.  Jacyln Carmer T. Breeonna Mone, MD FACG 

## 2018-01-15 ENCOUNTER — Encounter: Payer: 59 | Admitting: Gastroenterology

## 2018-01-21 ENCOUNTER — Encounter: Payer: Self-pay | Admitting: Gastroenterology

## 2018-03-25 ENCOUNTER — Other Ambulatory Visit: Payer: Self-pay

## 2018-03-25 ENCOUNTER — Ambulatory Visit (AMBULATORY_SURGERY_CENTER): Payer: Self-pay | Admitting: *Deleted

## 2018-03-25 VITALS — Ht 67.0 in | Wt 151.4 lb

## 2018-03-25 DIAGNOSIS — Z86010 Personal history of colonic polyps: Secondary | ICD-10-CM

## 2018-03-25 DIAGNOSIS — R1310 Dysphagia, unspecified: Secondary | ICD-10-CM

## 2018-03-25 MED ORDER — SUPREP BOWEL PREP KIT 17.5-3.13-1.6 GM/177ML PO SOLN: 1.0000 | Freq: Once | ORAL | 0 refills | Status: AC

## 2018-03-25 NOTE — Progress Notes (Signed)
No  soy allergy known to patient  Patient has an egg allergy, only to eggs, especially the yolk. Able to consume eggs in products without problems.No issues with past sedation with any surgeries  or procedures, no intubation problems  No diet pills per patient No home 02 use per patient  No blood thinners per patient  Pt denies issues with constipation  No A fib or A flutter  EMMI video sent to pt's e mail

## 2018-04-02 ENCOUNTER — Telehealth: Payer: Self-pay | Admitting: Gastroenterology

## 2018-04-02 NOTE — Telephone Encounter (Signed)
Left multiple voicemails for patient to call me back to be given benefit information. Spoke with Tish @ [employer] Patient does not have surgical benefits, but does have the option to upgrade. Patient will be 100% responsible if she decides to proceed as is or if she does not upgrade coverage.  [place] REF# [id number]

## 2018-04-07 ENCOUNTER — Ambulatory Visit (AMBULATORY_SURGERY_CENTER): Payer: 59 | Admitting: Gastroenterology

## 2018-04-07 ENCOUNTER — Encounter: Payer: Self-pay | Admitting: Gastroenterology

## 2018-04-07 VITALS — BP 105/70 | HR 72 | Temp 99.3°F | Resp 18 | Ht 67.0 in | Wt 151.0 lb

## 2018-04-07 DIAGNOSIS — R1310 Dysphagia, unspecified: Secondary | ICD-10-CM | POA: Diagnosis not present

## 2018-04-07 DIAGNOSIS — Z86010 Personal history of colonic polyps: Secondary | ICD-10-CM

## 2018-04-07 DIAGNOSIS — K222 Esophageal obstruction: Secondary | ICD-10-CM | POA: Diagnosis not present

## 2018-04-07 DIAGNOSIS — K635 Polyp of colon: Secondary | ICD-10-CM | POA: Diagnosis not present

## 2018-04-07 DIAGNOSIS — D120 Benign neoplasm of cecum: Secondary | ICD-10-CM | POA: Diagnosis not present

## 2018-04-07 MED ORDER — OMEPRAZOLE 20 MG PO CPDR: 20.0000 mg | DELAYED_RELEASE_CAPSULE | Freq: Every day | ORAL | 11 refills | Status: DC

## 2018-04-07 MED ORDER — SODIUM CHLORIDE 0.9 % IV SOLN: 500.0000 mL | Freq: Once | INTRAVENOUS | Status: DC

## 2018-04-07 NOTE — Progress Notes (Signed)
Called to room to assist during endoscopic procedure.  Patient ID and intended procedure confirmed with present staff. Received instructions for my participation in the procedure from the performing physician.  

## 2018-04-07 NOTE — Patient Instructions (Signed)
Please follow Dilation diet . Please read handouts provided. GI office in 6 weeks. Prilosec 20 mg by mouth daily. Continue present medications.     YOU HAD AN ENDOSCOPIC PROCEDURE TODAY AT [facility]:   Refer to the procedure report that was given to you for any specific questions about what was found during the examination.  If the procedure report does not answer your questions, please call your gastroenterologist to clarify.  If you requested that your care partner not be given the details of your procedure findings, then the procedure report has been included in a sealed envelope for you to review at your convenience later.  YOU SHOULD EXPECT: Some feelings of bloating in the abdomen. Passage of more gas than usual.  Walking can help get rid of the air that was put into your GI tract during the procedure and reduce the bloating. If you had a lower endoscopy (such as a colonoscopy or flexible sigmoidoscopy) you may notice spotting of blood in your stool or on the toilet paper. If you underwent a bowel prep for your procedure, you may not have a normal bowel movement for a few days.  Please Note:  You might notice some irritation and congestion in your nose or some drainage.  This is from the oxygen used during your procedure.  There is no need for concern and it should clear up in a day or so.  SYMPTOMS TO REPORT IMMEDIATELY:   Following lower endoscopy (colonoscopy or flexible sigmoidoscopy):  Excessive amounts of blood in the stool  Significant tenderness or worsening of abdominal pains  Swelling of the abdomen that is new, acute  Fever of 100F or higher   Following upper endoscopy (EGD)  Vomiting of blood or coffee ground material  New chest pain or pain under the shoulder blades  Painful or persistently difficult swallowing  New shortness of breath  Fever of 100F or higher  Black, tarry-looking stools  For urgent or emergent issues, a gastroenterologist can  be reached at any hour by calling [phone].   DIET:  Drink plenty of fluids but you should avoid alcoholic beverages for 24 hours.  ACTIVITY:  You should plan to take it easy for the rest of today and you should NOT DRIVE or use heavy machinery until tomorrow (because of the sedation medicines used during the test).    FOLLOW UP: Our staff will call the number listed on your records the next business day following your procedure to check on you and address any questions or concerns that you may have regarding the information given to you following your procedure. If we do not reach you, we will leave a message.  However, if you are feeling well and you are not experiencing any problems, there is no need to return our call.  We will assume that you have returned to your regular daily activities without incident.  If any biopsies were taken you will be contacted by phone or by letter within the next 1-3 weeks.  Please call us at [phone] if you have not heard about the biopsies in 3 weeks.    SIGNATURES/CONFIDENTIALITY: You and/or your care partner have signed paperwork which will be entered into your electronic medical record.  These signatures attest to the fact that that the information above on your After Visit Summary has been reviewed and is understood.  Full responsibility of the confidentiality of this discharge information lies with you and/or your care-partner.

## 2018-04-07 NOTE — Progress Notes (Signed)
Pt's states no medical or surgical changes since previsit or office visit. 

## 2018-04-07 NOTE — Op Note (Signed)
[facility] Patient Name: [name] Procedure Date: 04/07/2018 10:39 AM MRN: [id number] Endoscopist: Ladene Artist , MD Age: 47 Referring MD:  Date of Birth: [birth year] Gender: Female Account #: [id number] Procedure:                Colonoscopy Indications:              Surveillance: Personal history of adenomatous                            polyps on last colonoscopy 5 years ago Medicines:                Monitored Anesthesia Care Procedure:                Pre-Anesthesia Assessment:                           - Prior to the procedure, a History and Physical                            was performed, and patient medications and                            allergies were reviewed. The patient's tolerance of                            previous anesthesia was also reviewed. The risks                            and benefits of the procedure and the sedation                            options and risks were discussed with the patient.                            All questions were answered, and informed consent                            was obtained. Prior Anticoagulants: The patient has                            taken no previous anticoagulant or antiplatelet                            agents. ASA Grade Assessment: II - A patient with                            mild systemic disease. After reviewing the risks                            and benefits, the patient was deemed in                            satisfactory condition to undergo the procedure.  After obtaining informed consent, the colonoscope                            was passed under direct vision. Throughout the                            procedure, the patient's blood pressure, pulse, and                            oxygen saturations were monitored continuously. The                            Model PCF-[id number]) scope was introduced                            through the anus and  advanced to the the cecum,                            identified by appendiceal orifice and ileocecal                            valve. The ileocecal valve, appendiceal orifice,                            and rectum were photographed. The quality of the                            bowel preparation was adequate. The colonoscopy was                            performed without difficulty. The patient tolerated                            the procedure well. Scope In: 10:47:43 AM Scope Out: 11:00:34 AM Scope Withdrawal Time: 0 hours 10 minutes 1 second  Total Procedure Duration: 0 hours 12 minutes 51 seconds  Findings:                 The perianal and digital rectal examinations were                            normal.                           A 8 mm polyp was found in the cecum. The polyp was                            sessile. The polyp was removed with a cold snare.                            Resection and retrieval were complete.                           The exam was otherwise without abnormality on  direct and retroflexion views. Complications:            No immediate complications. Estimated blood loss:                            None. Estimated Blood Loss:     Estimated blood loss: none. Impression:               - One 8 mm polyp in the cecum, removed with a cold                            snare. Resected and retrieved.                           - The examination was otherwise normal on direct                            and retroflexion views. Recommendation:           - Repeat colonoscopy in 5 years for surveillance.                           - Patient has a contact number available for                            emergencies. The signs and symptoms of potential                            delayed complications were discussed with the                            patient. Return to normal activities tomorrow.                            Written discharge  instructions were provided to the                            patient.                           - Resume previous diet.                           - Continue present medications.                           - Await pathology results. Ladene Artist, MD 04/07/2018 11:02:53 AM This report has been signed electronically.

## 2018-04-07 NOTE — Op Note (Signed)
[facility] Patient Name: [name] Procedure Date: 04/07/2018 10:39 AM MRN: [id number] Endoscopist: Ladene Artist , MD Age: 47 Referring MD:  Date of Birth: 1971-03-26 Gender: Female Account #: [id number] Procedure:                Upper GI endoscopy Indications:              Dysphagia Medicines:                Monitored Anesthesia Care Procedure:                Pre-Anesthesia Assessment:                           - Prior to the procedure, a History and Physical                            was performed, and patient medications and                            allergies were reviewed. The patient's tolerance of                            previous anesthesia was also reviewed. The risks                            and benefits of the procedure and the sedation                            options and risks were discussed with the patient.                            All questions were answered, and informed consent                            was obtained. Prior Anticoagulants: The patient has                            taken no previous anticoagulant or antiplatelet                            agents. ASA Grade Assessment: II - A patient with                            mild systemic disease. After reviewing the risks                            and benefits, the patient was deemed in                            satisfactory condition to undergo the procedure.                           After obtaining informed consent, the endoscope was  passed under direct vision. Throughout the                            procedure, the patient's blood pressure, pulse, and                            oxygen saturations were monitored continuously. The                            Endoscope was introduced through the mouth, and                            advanced to the second part of duodenum. The upper                            GI endoscopy was accomplished without  difficulty.                            The patient tolerated the procedure well. Scope In: Scope Out: Findings:                 One benign-appearing, intrinsic moderate stenosis                            was found at the gastroesophageal junction. This                            stenosis measured 1.3 cm (inner diameter). The                            stenosis was traversed. A guidewire was placed and                            the scope was withdrawn. Dilations were performed                            with Savary dilators with mild resistance at 14 mm,                            15 mm and 16 mm.                           The exam of the esophagus was otherwise normal.                           A small hiatal hernia was present.                           The exam of the stomach was otherwise normal.                           The duodenal bulb and second portion of the  duodenum were normal. Complications:            No immediate complications. Estimated Blood Loss:     Estimated blood loss was minimal. Impression:               - Benign-appearing esophageal stenosis. Dilated.                           - Small hiatal hernia.                           - Normal duodenal bulb and second portion of the                            duodenum.                           - No specimens collected. Recommendation:           - Clear liquid diet for 2 hours, then advance as                            tolerated to soft diet today.                           - Continue present medications.                           - Patient has a contact number available for                            emergencies. The signs and symptoms of potential                            delayed complications were discussed with the                            patient. Return to normal activities tomorrow.                            Written discharge instructions were provided to the                             patient.                           - Prilosec (omeprazole) 20 mg PO daily, 1 year of                            refills.                           - GI office appointment in 6 weeks. Ladene Artist, MD 04/07/2018 11:15:53 AM This report has been signed electronically.

## 2018-04-07 NOTE — Progress Notes (Signed)
To PACU, VSS. Report to [email or web address] 

## 2018-04-08 ENCOUNTER — Telehealth: Payer: Self-pay

## 2018-04-08 NOTE — Telephone Encounter (Signed)
  Follow up Call-  Call back number 04/07/2018  Post procedure Call Back phone  # [phone]  Permission to leave phone message Yes  Some recent data might be hidden     Patient questions:  Do you have a fever, pain , or abdominal swelling? No. Pain Score  0 *  Have you tolerated food without any problems? Yes.    Have you been able to return to your normal activities? Yes.    Do you have any questions about your discharge instructions: Diet   No. Medications  No. Follow up visit  No.  Do you have questions or concerns about your Care? No.  Actions: * If pain score is 4 or above: No action needed, pain <4.

## 2018-04-20 ENCOUNTER — Encounter: Payer: Self-pay | Admitting: Gastroenterology

## 2018-08-28 DIAGNOSIS — N920 Excessive and frequent menstruation with regular cycle: Secondary | ICD-10-CM | POA: Insufficient documentation

## 2018-08-31 ENCOUNTER — Encounter: Payer: Self-pay | Admitting: Family Medicine

## 2018-08-31 ENCOUNTER — Ambulatory Visit (INDEPENDENT_AMBULATORY_CARE_PROVIDER_SITE_OTHER): Payer: 59 | Admitting: Family Medicine

## 2018-08-31 VITALS — BP 102/58 | HR 76 | Temp 98.6°F | Ht 66.75 in | Wt 157.0 lb

## 2018-08-31 DIAGNOSIS — Z0000 Encounter for general adult medical examination without abnormal findings: Secondary | ICD-10-CM | POA: Diagnosis not present

## 2018-08-31 DIAGNOSIS — Z23 Encounter for immunization: Secondary | ICD-10-CM | POA: Diagnosis not present

## 2018-08-31 DIAGNOSIS — F4321 Adjustment disorder with depressed mood: Secondary | ICD-10-CM | POA: Diagnosis not present

## 2018-08-31 DIAGNOSIS — R1310 Dysphagia, unspecified: Secondary | ICD-10-CM

## 2018-08-31 DIAGNOSIS — R079 Chest pain, unspecified: Secondary | ICD-10-CM | POA: Diagnosis not present

## 2018-08-31 DIAGNOSIS — Z8249 Family history of ischemic heart disease and other diseases of the circulatory system: Secondary | ICD-10-CM | POA: Diagnosis not present

## 2018-08-31 LAB — CBC WITH DIFFERENTIAL/PLATELET
Basophils Absolute: 0 10*3/uL (ref 0.0–0.1)
Basophils Relative: 0.7 % (ref 0.0–3.0)
Eosinophils Absolute: 0.1 10*3/uL (ref 0.0–0.7)
Eosinophils Relative: 2.1 % (ref 0.0–5.0)
HCT: 42.3 % (ref 36.0–46.0)
Hemoglobin: 14.1 g/dL (ref 12.0–15.0)
LYMPHS ABS: 2.2 10*3/uL (ref 0.7–4.0)
Lymphocytes Relative: 38.4 % (ref 12.0–46.0)
MCHC: 33.4 g/dL (ref 30.0–36.0)
MCV: 98.8 fl (ref 78.0–100.0)
Monocytes Absolute: 0.4 10*3/uL (ref 0.1–1.0)
Monocytes Relative: 6.8 % (ref 3.0–12.0)
NEUTROS PCT: 52 % (ref 43.0–77.0)
Neutro Abs: 2.9 10*3/uL (ref 1.4–7.7)
Platelets: 343 10*3/uL (ref 150.0–400.0)
RBC: 4.28 Mil/uL (ref 3.87–5.11)
RDW: 12.6 % (ref 11.5–15.5)
WBC: 5.6 10*3/uL (ref 4.0–10.5)

## 2018-08-31 LAB — COMPREHENSIVE METABOLIC PANEL
ALT: 24 U/L (ref 0–35)
AST: 21 U/L (ref 0–37)
Albumin: 4.1 g/dL (ref 3.5–5.2)
Alkaline Phosphatase: 71 U/L (ref 39–117)
BUN: 11 mg/dL (ref 6–23)
CALCIUM: 9.3 mg/dL (ref 8.4–10.5)
CO2: 28 meq/L (ref 19–32)
Chloride: 105 mEq/L (ref 96–112)
Creatinine, Ser: 0.77 mg/dL (ref 0.40–1.20)
GFR: 79.79 mL/min (ref >60.00)
Glucose, Bld: 89 mg/dL (ref 70–99)
Potassium: 4.4 mEq/L (ref 3.5–5.1)
Sodium: 138 mEq/L (ref 135–145)
Total Bilirubin: 0.4 mg/dL (ref 0.2–1.2)
Total Protein: 6.6 g/dL (ref 6.0–8.3)

## 2018-08-31 LAB — VITAMIN D 25 HYDROXY (VIT D DEFICIENCY, FRACTURES): VITD: 22.71 ng/mL — ABNORMAL LOW (ref 30.00–100.00)

## 2018-08-31 LAB — LIPID PANEL
Cholesterol: 190 mg/dL (ref 0–200)
HDL: 60 mg/dL (ref >39.00)
LDL Cholesterol: 114 mg/dL — ABNORMAL HIGH (ref 0–99)
NonHDL: 130.1
TRIGLYCERIDES: 80 mg/dL (ref 0.0–149.0)
Total CHOL/HDL Ratio: 3
VLDL: 16 mg/dL (ref 0.0–40.0)

## 2018-08-31 LAB — H. PYLORI ANTIBODY, IGG: H Pylori IgG: NEGATIVE

## 2018-08-31 LAB — FERRITIN: FERRITIN: 46.8 ng/mL (ref 10.0–291.0)

## 2018-08-31 LAB — TSH: TSH: 1.42 u[IU]/mL (ref 0.35–4.50)

## 2018-08-31 LAB — TROPONIN I: TNIDX: 0 ug/l (ref 0.00–0.06)

## 2018-08-31 NOTE — Assessment & Plan Note (Signed)
Unlikely cardiac in nature. EKG reassuring- EKG showed NSR other than short PR interval (unchanged from 10/2013). Will order troponin, h pylori and other labs, including cholesterol since she has known  h/o of CAD now.

## 2018-08-31 NOTE — Progress Notes (Signed)
Subjective:   Patient ID: [name], female    DOB: 01/28/1971, 48 y.o.   MRN: [id number]  [name] is a pleasant 48 y.o. year old female who presents to clinic today with Annual Exam (Patient is here today for CPE.  She is seen at [facility] for [place].  She is not currently fasting. )  on 08/31/2018  HPI:  Here for CPX.  I have not seen her in several years.  [facility] does her paps and mammograms.  Has appt with GYN next month.  H/o adenomatous polyps- had colonoscopy done by Dr. Fuller Plan on 04/07/18.  Also followed by GI for dysphagia. Had esophageal stricture dilated with endoscopy in 03/2018.  Father recently of an MI- was being treated for prostate CA.  They did find CAD as well.  Although she feels she is handling the grief, she has been under more stress since her dad died. A few weeks ago, she woke up with CP and it was in her back as well.  With her dad's recent death and diagnosis of CAD, it concerned her.  Took an ASA.  Didn't feel like GERD to her.  Pain lasted a few hours. She did restart prilosec 20 mg the following day.  Has not had pain again.  Has had no chest pain with exertion.  Saw Dr. Rockey Situ in 06/25/2011 for palpitations.  The ASCVD Risk score Mikey Bussing DC Jr., et al., 2013) failed to calculate for the following reasons:   Cannot find a previous HDL lab   Cannot find a previous total cholesterol lab  No results found for: CHOL, HDL, LDLCALC, LDLDIRECT, TRIG, [facility]  Health Maintenance  Topic Date Due  . TETANUS/TDAP  01/08/1989  . PAP SMEAR-Modifier  09/07/2018 (Originally 10/08/2015)  . COLONOSCOPY  04/08/2023  . INFLUENZA VACCINE  Discontinued  . HIV Screening  Discontinued   Current Outpatient Medications on File Prior to Visit  Medication Sig Dispense Refill  . Ascorbic Acid (VITAMIN C PO) Take by mouth.    Marland Kitchen ibuprofen (IBU) 800 MG tablet IBU 800 mg tablet    . Multiple Vitamin  (MULTIVITAMIN) tablet Take 1 tablet by mouth daily.    . Omega-3 Fatty Acids (FISH OIL PO) Take by mouth.    Marland Kitchen omeprazole (PRILOSEC) 20 MG capsule Take 1 capsule (20 mg total) by mouth daily. 30 capsule 11   No current facility-administered medications on file prior to visit.     Allergies  Allergen Reactions  . Eggs Or Egg-Derived Products     Causes abd pain and rash / can eat eggs mixed in food.    Past Medical History:  Diagnosis Date  . Cardiac murmur    As a child  . Chronic headache   . Colon polyps   . Esophageal stricture   . Foot fracture   . GERD (gastroesophageal reflux disease)   . Hiatal hernia   . Uterine polyp     Past Surgical History:  Procedure Laterality Date  . COLONOSCOPY    . POLYPECTOMY     uterine  . uterine polypectomy      Family History  Problem Relation Age of Onset  . Diabetes Father   . Hyperlipidemia Father   . Hypertension Father   . Cancer Paternal Aunt 21       breast  . Breast cancer Paternal Aunt   . Ovarian cancer Maternal Aunt   . Colon  polyps Maternal Aunt   . Colon polyps Maternal Uncle   . Colon cancer Neg Hx   . Esophageal cancer Neg Hx   . Rectal cancer Neg Hx   . Stomach cancer Neg Hx     Social History   Socioeconomic History  . Marital status: Married    Spouse name: Not on file  . Number of children: Not on file  . Years of education: Not on file  . Highest education level: Not on file  Occupational History  . Not on file  Social Needs  . Financial resource strain: Not on file  . Food insecurity:    Worry: Not on file    Inability: Not on file  . Transportation needs:    Medical: Not on file    Non-medical: Not on file  Tobacco Use  . Smoking status: Former Smoker    Types: Cigarettes    Last attempt to quit: 04/08/1991    Years since quitting: 27.4  . Smokeless tobacco: Never Used  . Tobacco comment: [place].  Substance and Sexual Activity  . Alcohol use: Yes     Alcohol/week: 2.0 standard drinks    Types: 2 Glasses of wine per week  . Drug use: No  . Sexual activity: Yes    Birth control/protection: Condom, Rhythm  Lifestyle  . Physical activity:    Days per week: Not on file    Minutes per session: Not on file  . Stress: Not on file  Relationships  . Social connections:    Talks on phone: Not on file    Gets together: Not on file    Attends religious service: Not on file    Active member of club or organization: Not on file    Attends meetings of clubs or organizations: Not on file    Relationship status: Not on file  . Intimate partner violence:    Fear of current or ex partner: Not on file    Emotionally abused: Not on file    Physically abused: Not on file    Forced sexual activity: Not on file  Other Topics Concern  . Not on file  Social History Narrative  . Not on file   The PMH, PSH, Social History, Family History, Medications, and allergies have been reviewed in [facility], and have been updated if relevant.   Review of Systems  Constitutional: Negative.   HENT: Negative.   Eyes: Negative.   Respiratory: Negative.   Cardiovascular: Positive for chest pain. Negative for palpitations and leg swelling.  Gastrointestinal: Negative.   Endocrine: Negative.   Genitourinary: Negative.   Musculoskeletal: Negative.   Skin: Negative.   Allergic/Immunologic: Negative.   Neurological: Negative.   Hematological: Negative.   Psychiatric/Behavioral: Negative for agitation, behavioral problems, confusion, decreased concentration, dysphoric mood, hallucinations, self-injury, sleep disturbance and suicidal ideas. The patient is nervous/anxious. The patient is not hyperactive.   All other systems reviewed and are negative.      Objective:    BP (!) 102/58 (BP Location: Left Arm, Patient Position: Sitting, Cuff Size: Normal)   Pulse 76   Temp 98.6 F (37 C) (Oral)   Ht 5' 6.75" (1.695 m)   Wt 157 lb (71.2 kg)   LMP 08/24/2018   SpO2  97%   BMI 24.77 kg/m    Physical Exam   General:  Well-developed,well-nourished,in no acute distress; alert,appropriate and cooperative throughout examination Head:  normocephalic and atraumatic.   Eyes:  vision grossly intact, PERRL  Ears:  R ear normal and L ear normal externally, TMs clear bilaterally Nose:  no external deformity.   Mouth:  good dentition.   Neck:  No deformities, masses, or tenderness noted. Lungs:  Normal respiratory effort, chest expands symmetrically. Lungs are clear to auscultation, no crackles or wheezes. Heart:  Normal rate and regular rhythm. S1 and S2 normal without gallop, murmur, click, rub or other extra sounds. Abdomen:  Bowel sounds positive,abdomen soft and non-tender without masses, organomegaly or hernias noted. Msk:  No deformity or scoliosis noted of thoracic or lumbar spine.   Extremities:  No clubbing, cyanosis, edema, or deformity noted with normal full range of motion of all joints.   Neurologic:  alert & oriented X3 and gait normal.   Skin:  Intact without suspicious lesions or rashes Cervical Nodes:  No lymphadenopathy noted Axillary Nodes:  No palpable lymphadenopathy Psych:  Cognition and judgment appear intact. Alert and cooperative with normal attention span and concentration. No apparent delusions, illusions, hallucinations       Assessment & Plan:   Well woman exam without gynecological exam - Plan: Comp Met (CMET), CBC w/Diff, Lipid Profile, TSH, VITAMIN D 25 Hydroxy (Vit-D Deficiency, Fractures), Tdap vaccine greater than or equal to 7yo IM  Need for Tdap vaccination - Plan: Tdap vaccine greater than or equal to 7yo IM No follow-ups on file.

## 2018-08-31 NOTE — Assessment & Plan Note (Signed)
Reviewed preventive care protocols, scheduled due services, and updated immunizations Discussed nutrition, exercise, diet, and healthy lifestyle.  

## 2018-08-31 NOTE — Patient Instructions (Addendum)
I am so sorry for your loss. Great to see you. I will call you with your lab results from today and you can view them online.   Please consider calling hospice for grief counseling.

## 2018-08-31 NOTE — Assessment & Plan Note (Signed)
Did discuss grief counseling- free through hospice and I can also refer her to see a therapist in our office or at other locations. She will think about it and let me know. She feels she is mostly coping okay other than perhaps a panic attack and worry about her mom.  Denies feeling depressed.

## 2018-09-01 ENCOUNTER — Telehealth: Payer: Self-pay

## 2018-09-01 DIAGNOSIS — E559 Vitamin D deficiency, unspecified: Secondary | ICD-10-CM

## 2018-09-01 MED ORDER — VITAMIN D (ERGOCALCIFEROL) 1.25 MG (50000 UNIT) PO CAPS: 50000.0000 [IU] | ORAL_CAPSULE | ORAL | 0 refills | Status: AC

## 2018-09-01 NOTE — Telephone Encounter (Signed)
Pt aware of lab results/sent in Rx/created future order/pt will call back when not driving to schedule a lab visit for 8-12 weeks from now/thx dmf

## 2018-09-01 NOTE — Telephone Encounter (Signed)
-----   Message from Lucille Passy, MD sent at 08/31/2018  2:13 PM EST ----- Please call pt- troponin neg which is reassuring (no indication of heart strain). Blood sugar, liver function, kidney function, thyroid function normal. H pylori negative- the bacteria that can grow in the gut that ew talked about today. Cholesterol looks good. No indication of anemia.  Vitamin Joshia Kitchings is low which can make you very tired and achy.  Please call in Vit D3 50,000 units x 12 weeks - 1 tab po weekly x 12 weeks (number 12, no refills), then continue VitD 2000 IU daily.  Recheck in 8-12 weeks.

## 2018-09-22 ENCOUNTER — Telehealth: Payer: Self-pay | Admitting: Family Medicine

## 2018-09-22 NOTE — Telephone Encounter (Signed)
Copied from [place] [phone] [id number]. Topic: General - Other >> Sep 22, 2018 10:54 AM Keene Breath wrote: Reason for CRM: Patient would like a call back from the nurse or doctor regarding her vitamin D medication.  Please advise and call patient back at [phone]

## 2018-09-24 NOTE — Telephone Encounter (Addendum)
Left vm to call back to find out what pt's question is

## 2018-09-30 NOTE — Telephone Encounter (Signed)
LMOVM that she is to take 1q wk for 12 weeks of the Vit-Isatou Agredano Rx and repeat the lab/asked that she plz call to schedule a lab visit and also advise if she has any more questions/thx dmf

## 2018-10-12 ENCOUNTER — Telehealth: Payer: Self-pay

## 2018-10-12 ENCOUNTER — Ambulatory Visit: Payer: 59 | Admitting: Family Medicine

## 2018-10-12 NOTE — Telephone Encounter (Signed)
TA-I LMOVM for pt stating that appt today has been changed to a Tele-Visit as we are trying to keep patients healthy by allowing them to have phone visits for less exposure to keep them healthy/Advised that she will be getting a call from [place] at her appt time unles she feels she needs to cancel/thx dmf

## 2019-04-07 ENCOUNTER — Other Ambulatory Visit: Payer: Self-pay | Admitting: *Deleted

## 2019-04-07 DIAGNOSIS — Z20822 Contact with and (suspected) exposure to covid-19: Secondary | ICD-10-CM

## 2019-04-08 LAB — NOVEL CORONAVIRUS, NAA: SARS-CoV-2, NAA: NOT DETECTED

## 2019-04-13 ENCOUNTER — Other Ambulatory Visit: Payer: Self-pay | Admitting: *Deleted

## 2019-04-13 DIAGNOSIS — Z20822 Contact with and (suspected) exposure to covid-19: Secondary | ICD-10-CM

## 2019-04-14 LAB — NOVEL CORONAVIRUS, NAA: SARS-CoV-2, NAA: NOT DETECTED

## 2019-06-11 ENCOUNTER — Other Ambulatory Visit: Payer: Self-pay

## 2019-06-11 DIAGNOSIS — Z20822 Contact with and (suspected) exposure to covid-19: Secondary | ICD-10-CM

## 2019-06-14 LAB — NOVEL CORONAVIRUS, NAA: SARS-CoV-2, NAA: NOT DETECTED

## 2019-07-09 ENCOUNTER — Other Ambulatory Visit: Payer: 59

## 2019-09-19 ENCOUNTER — Ambulatory Visit: Payer: 59 | Attending: Internal Medicine

## 2019-09-19 DIAGNOSIS — Z23 Encounter for immunization: Secondary | ICD-10-CM | POA: Insufficient documentation

## 2019-09-19 NOTE — Progress Notes (Signed)
   Covid-19 Vaccination Clinic  Name:  [name]    MRN: [id number] DOB: 1970/08/19  09/19/2019  [name] was observed post Covid-19 immunization for 15 minutes without incidence. She was provided with Vaccine Information Sheet and instruction to access the V-Safe system.   [name] was instructed to call 911 with any severe reactions post vaccine: Marland Kitchen Difficulty breathing  . Swelling of your face and throat  . A fast heartbeat  . A bad rash all over your body  . Dizziness and weakness    Immunizations Administered    Name Date Dose VIS Date Route   Pfizer COVID-19 Vaccine 09/19/2019  1:05 PM 0.3 mL 07/02/2019 Intramuscular   Manufacturer: [place]   Lot: [id number]   [place]: [id number]

## 2019-09-30 ENCOUNTER — Ambulatory Visit: Payer: 59 | Attending: Internal Medicine

## 2019-09-30 DIAGNOSIS — Z20822 Contact with and (suspected) exposure to covid-19: Secondary | ICD-10-CM

## 2019-10-01 LAB — NOVEL CORONAVIRUS, NAA: SARS-CoV-2, NAA: NOT DETECTED

## 2019-10-12 ENCOUNTER — Ambulatory Visit: Payer: 59 | Attending: Internal Medicine

## 2019-10-12 DIAGNOSIS — Z23 Encounter for immunization: Secondary | ICD-10-CM

## 2019-10-12 NOTE — Progress Notes (Signed)
   Covid-19 Vaccination Clinic  Name:  MIKAYA GILTNER    MRN: [id number] DOB: 1971/04/11  10/12/2019  Ms. Rise was observed post Covid-19 immunization for 15 minutes without incident. She was provided with Vaccine Information Sheet and instruction to access the V-Safe system.   Ms. Pingitore was instructed to call 911 with any severe reactions post vaccine: Marland Kitchen Difficulty breathing  . Swelling of face and throat  . A fast heartbeat  . A bad rash all over body  . Dizziness and weakness   Immunizations Administered    Name Date Dose VIS Date Route   Pfizer COVID-19 Vaccine 10/12/2019  1:47 PM 0.3 mL 07/02/2019 Intramuscular   Manufacturer: [employer]   Lot: [id number]   [place]: [id number]

## 2020-03-31 ENCOUNTER — Other Ambulatory Visit: Payer: 59

## 2020-03-31 ENCOUNTER — Other Ambulatory Visit: Payer: Self-pay | Admitting: Critical Care Medicine

## 2020-03-31 DIAGNOSIS — Z20822 Contact with and (suspected) exposure to covid-19: Secondary | ICD-10-CM

## 2020-04-03 LAB — NOVEL CORONAVIRUS, NAA: SARS-CoV-2, NAA: NOT DETECTED

## 2020-04-19 ENCOUNTER — Encounter: Payer: Self-pay | Admitting: Family Medicine

## 2020-04-19 ENCOUNTER — Telehealth (INDEPENDENT_AMBULATORY_CARE_PROVIDER_SITE_OTHER): Payer: 59 | Admitting: Family Medicine

## 2020-04-19 VITALS — Ht 66.75 in | Wt 140.0 lb

## 2020-04-19 DIAGNOSIS — E559 Vitamin D deficiency, unspecified: Secondary | ICD-10-CM

## 2020-04-19 DIAGNOSIS — U071 COVID-19: Secondary | ICD-10-CM | POA: Diagnosis not present

## 2020-04-19 DIAGNOSIS — R1310 Dysphagia, unspecified: Secondary | ICD-10-CM | POA: Diagnosis not present

## 2020-04-19 MED ORDER — OMEPRAZOLE 20 MG PO CPDR: 20.0000 mg | DELAYED_RELEASE_CAPSULE | Freq: Every day | ORAL | 3 refills | Status: DC

## 2020-04-19 NOTE — Progress Notes (Signed)
Virtual Visit via Video Note  I connected with [name] on 04/19/20 at  3:00 PM EDT by a video enabled telemedicine application and verified that I am speaking with the correct person using two identifiers. Location patient: home Location provider: work  Persons participating in the virtual visit: patient, provider  I discussed the limitations of evaluation and management by telemedicine and the availability of in person appointments. The patient expressed understanding and agreed to proceed.  Chief Complaint  Patient presents with  . Establish Care    TOC- no concerns. has covid since last week.      HPI: [name] is a 49 y.o. female who was previously a patient of Dr. Deborra Medina who is seen today for TOC appt. She was diagnosed with covid about 1 week. She has a dry cough, stuffy nose, headache. No fever. No SOB, DOE. + fatigue. She is taking sinus medication.   Pt has a h/o Vit D def in 08/2008. She took Rx for few months and has since been taking OTC 2000IU daily.  She has been taking 4000IU daily for the past week since dx with covid.  Last colonoscopy in 03/2018 - Dr. Fuller Plan ([facility]). She had EGD with dilation at that time. GYN - Dr. Meredith Pel - seen annually  She needs refill of omeprazole 20mg  daily. symptoms well controlled.   Past Medical History:  Diagnosis Date  . Cardiac murmur    As a child  . Chronic headache   . Colon polyps   . Esophageal stricture   . Foot fracture   . GERD (gastroesophageal reflux disease)   . Hiatal hernia   . Uterine polyp     Past Surgical History:  Procedure Laterality Date  . COLONOSCOPY    . POLYPECTOMY     uterine  . uterine polypectomy      Family History  Problem Relation Age of Onset  . Diabetes Father   . Hyperlipidemia Father   . Hypertension Father   . Cancer Father        prostate CA  . Heart disease Father   . Cancer Paternal Aunt 90       breast  . Breast cancer Paternal Aunt   . Ovarian cancer Maternal  Aunt   . Colon polyps Maternal Aunt   . Colon polyps Maternal Uncle   . Colon cancer Neg Hx   . Esophageal cancer Neg Hx   . Rectal cancer Neg Hx   . Stomach cancer Neg Hx     Social History   Tobacco Use  . Smoking status: Former Smoker    Types: Cigarettes    Quit date: 04/08/1991    Years since quitting: 29.0  . Smokeless tobacco: Never Used  . Tobacco comment: [place].  Vaping Use  . Vaping Use: Never used  Substance Use Topics  . Alcohol use: Yes    Alcohol/week: 2.0 standard drinks    Types: 2 Glasses of wine per week  . Drug use: No     Current Outpatient Medications:  .  Ascorbic Acid (VITAMIN C PO), Take by mouth., Disp: , Rfl:  .  Cholecalciferol (VITAMIN D-3) 25 MCG (1000 UT) CAPS, Take 2 capsules by mouth., Disp: , Rfl:  .  Multiple Vitamin (MULTIVITAMIN) tablet, Take 1 tablet by mouth daily., Disp: , Rfl:  .  Omega-3 Fatty Acids (FISH OIL PO), Take by mouth., Disp: , Rfl:  .  ibuprofen (IBU) 800 MG tablet, IBU  800 mg tablet (Patient not taking: Reported on 04/19/2020), Disp: , Rfl:  .  omeprazole (PRILOSEC) 20 MG capsule, Take 1 capsule (20 mg total) by mouth daily. (Patient not taking: Reported on 04/19/2020), Disp: 30 capsule, Rfl: 11  Allergies  Allergen Reactions  . Eggs Or Egg-Derived Products     Causes abd pain and rash / can eat eggs mixed in food.      ROS: See pertinent positives and negatives per HPI.   EXAM:  VITALS per patient if applicable: Ht 5' 2.23" (1.695 m)   Wt 140 lb (63.5 kg) Comment: pt reported  BMI 22.09 kg/m    GENERAL: alert, oriented, appears well and in no acute distress  HEENT: atraumatic, conjunctiva clear, no obvious abnormalities on inspection of external nose and ears  NECK: normal movements of the head and neck  LUNGS: on inspection no signs of respiratory distress, breathing rate appears normal, no obvious gross SOB, gasping or wheezing, no conversational dyspnea  CV: no obvious  cyanosis  PSYCH/NEURO: pleasant and cooperative, no obvious depression or anxiety, speech and thought processing grossly intact   ASSESSMENT AND PLAN: 1. Vitamin D deficiency - taking 2000IU daily, due for recheck of Vit D level  2. COVID-19 virus infection - symptoms x 1 week, overall mild - cont with supportive care - f/u if symptoms worsen  3. Dysphagia, unspecified type - follows with GI Dr. Fuller Plan - has EGD with dilation in 03/2011 - no issues since that time Refill: - omeprazole (PRILOSEC) 20 MG capsule; Take 1 capsule (20 mg total) by mouth daily.  Dispense: 90 capsule; Refill: 3   Pt is due for CPE, fasting labs. Will schedule appt    I discussed the assessment and treatment plan with the patient. The patient was provided an opportunity to ask questions and all were answered. The patient agreed with the plan and demonstrated an understanding of the instructions.   The patient was advised to call back or seek an in-person evaluation if the symptoms worsen or if the condition fails to improve as anticipated.   Letta Median, DO

## 2020-04-20 ENCOUNTER — Encounter: Payer: 59 | Admitting: Family Medicine

## 2020-04-24 ENCOUNTER — Encounter: Payer: Self-pay | Admitting: Nurse Practitioner

## 2020-04-24 ENCOUNTER — Telehealth (INDEPENDENT_AMBULATORY_CARE_PROVIDER_SITE_OTHER): Payer: 59 | Admitting: Nurse Practitioner

## 2020-04-24 VITALS — Ht 67.0 in | Wt 140.0 lb

## 2020-04-24 DIAGNOSIS — R519 Headache, unspecified: Secondary | ICD-10-CM

## 2020-04-24 DIAGNOSIS — U071 COVID-19: Secondary | ICD-10-CM

## 2020-04-24 MED ORDER — ASPIRIN-ACETAMINOPHEN-CAFFEINE 250-250-65 MG PO TABS: 1.0000 | ORAL_TABLET | Freq: Four times a day (QID) | ORAL | 0 refills | Status: DC | PRN

## 2020-04-24 MED ORDER — ALBUTEROL SULFATE HFA 108 (90 BASE) MCG/ACT IN AERS: 1.0000 | INHALATION_SPRAY | Freq: Four times a day (QID) | RESPIRATORY_TRACT | 0 refills | Status: DC | PRN

## 2020-04-24 NOTE — Progress Notes (Signed)
Virtual Visit via Video Note  I connected with@ on 04/24/20 at 11:00 AM EDT by a video enabled telemedicine application and verified that I am speaking with the correct person using two identifiers.  Location: Patient:Home Provider: Office Participants: patient and provider  I discussed the limitations of evaluation and management by telemedicine and the availability of in person appointments. I also discussed with the patient that there may be a patient responsible charge related to this service. The patient expressed understanding and agreed to proceed.  CC:Pt states she is just getting over [place] from 04/13/20 and she has been having some sinus pressure, dry cough, and severe head pressure.   History of Present Illness: Headache  This is a new problem. The current episode started in the past 7 days. The problem occurs constantly. The problem has been unchanged. The pain is located in the retro-orbital and frontal region. The pain does not radiate. The pain quality is not similar to prior headaches. The quality of the pain is described as aching and dull (pressure). The pain is moderate. Associated symptoms include coughing, ear pain and sinus pressure. Pertinent negatives include no anorexia, blurred vision, dizziness, drainage, eye pain, eye watering, fever, hearing loss, insomnia, loss of balance, muscle aches, nausea, neck pain, numbness, phonophobia, photophobia, rhinorrhea, scalp tenderness, seizures, sore throat, swollen glands, tingling, tinnitus, visual change or vomiting. The symptoms are aggravated by unknown. She has tried NSAIDs and acetaminophen (decongestant) for the symptoms. The treatment provided mild relief. There is no history of cancer, cluster headaches, hypertension, migraine headaches, migraines in the family, obesity, recent head traumas or sinus disease.  Cough This is a new problem. The current episode started in the past 7 days. The problem has been unchanged. The cough  is non-productive. Associated symptoms include ear pain and headaches. Pertinent negatives include no chest pain, chills, fever, heartburn, myalgias, nasal congestion, postnasal drip, rhinorrhea, sore throat, shortness of breath, sweats or wheezing. Nothing aggravates the symptoms. She has tried OTC cough suppressant for the symptoms. The treatment provided no relief. There is no history of asthma, bronchitis, COPD, emphysema, environmental allergies or pneumonia.  reports loss of taste and small with COVID infection.   Observations/Objective: Physical Exam Nursing note reviewed.  Eyes:     Extraocular Movements: Extraocular movements intact.     Conjunctiva/sclera: Conjunctivae normal.  Pulmonary:     Effort: Pulmonary effort is normal. No respiratory distress.     Comments: Coughing intermittently during video call Neurological:     Mental Status: She is alert and oriented to person, place, and time.  Psychiatric:        Mood and Affect: Mood normal.        Behavior: Behavior normal.        Thought Content: Thought content normal.    Assessment and Plan: [name] was seen today for acute visit.  Diagnoses and all orders for this visit:  COVID-19 -     albuterol (VENTOLIN HFA) 108 (90 Base) MCG/ACT inhaler; Inhale 1-2 puffs into the lungs every 6 (six) hours as needed for wheezing or shortness of breath. -     aspirin-acetaminophen-caffeine (EXCEDRIN MIGRAINE) 250-250-65 MG tablet; Take 1-2 tablets by mouth every 6 (six) hours as needed for headache.  Sinus headache -     albuterol (VENTOLIN HFA) 108 (90 Base) MCG/ACT inhaler; Inhale 1-2 puffs into the lungs every 6 (six) hours as needed for wheezing or shortness of breath. -     aspirin-acetaminophen-caffeine (EXCEDRIN MIGRAINE) 250-250-65 MG tablet;  Take 1-2 tablets by mouth every 6 (six) hours as needed for headache.   Follow Up Instructions: Call office or send mychart message if no improvement in 24hrs. Stop mucinex, flonase  and pseudoephedrine. Maintain adequate oral hydration. Obtain BP machine and pulse oximeter to monitor vital signs once a day.   I discussed the assessment and treatment plan with the patient. The patient was provided an opportunity to ask questions and all were answered. The patient agreed with the plan and demonstrated an understanding of the instructions.   The patient was advised to call back or seek an in-person evaluation if the symptoms worsen or if the condition fails to improve as anticipated.   Wilfred Lacy, NP

## 2021-05-17 ENCOUNTER — Encounter: Payer: Self-pay | Admitting: Podiatry

## 2021-05-17 ENCOUNTER — Other Ambulatory Visit: Payer: Self-pay

## 2021-05-17 ENCOUNTER — Ambulatory Visit: Payer: BC Managed Care – PPO | Admitting: Podiatry

## 2021-05-17 DIAGNOSIS — L600 Ingrowing nail: Secondary | ICD-10-CM | POA: Diagnosis not present

## 2021-05-17 MED ORDER — DOXYCYCLINE HYCLATE 100 MG PO TABS: 100.0000 mg | ORAL_TABLET | Freq: Two times a day (BID) | ORAL | 0 refills | Status: AC

## 2021-05-17 NOTE — Progress Notes (Signed)
Subjective:  Patient ID: [name], female    DOB: 10/03/1970,  MRN: [id number]  Chief Complaint  Patient presents with   Ingrown Toenail    Right hallux ingrown nail     50 y.o. female presents with the above complaint.  Patient presents with right hallux medial border ingrown.  Patient states painful toes are some redness associated with it.  She states been going for quite some time she has done some self debridement.  None of which has helped.  No there is infection present.  There are some drainage has been coming out of it.  She has been soaking Epson salt.  She would like to have it removed.  Pain scale is 8 out of 10 hurts with ambulation   Review of Systems: Negative except as noted in the HPI. Denies N/V/F/Ch.  Past Medical History:  Diagnosis Date   Cardiac murmur    As a child   Chronic headache    Colon polyps    Esophageal stricture    Foot fracture    GERD (gastroesophageal reflux disease)    Hiatal hernia    Uterine polyp     Current Outpatient Medications:    doxycycline (VIBRA-TABS) 100 MG tablet, Take 1 tablet (100 mg total) by mouth 2 (two) times daily for 10 days., Disp: 20 tablet, Rfl: 0   albuterol (VENTOLIN HFA) 108 (90 Base) MCG/ACT inhaler, Inhale 1-2 puffs into the lungs every 6 (six) hours as needed for wheezing or shortness of breath., Disp: 6.7 g, Rfl: 0   Ascorbic Acid (VITAMIN C PO), Take by mouth., Disp: , Rfl:    aspirin-acetaminophen-caffeine (EXCEDRIN MIGRAINE) 250-250-65 MG tablet, Take 1-2 tablets by mouth every 6 (six) hours as needed for headache., Disp: 30 tablet, Rfl: 0   Cholecalciferol (VITAMIN D-3) 25 MCG (1000 UT) CAPS, Take 2 capsules by mouth., Disp: , Rfl:    Multiple Vitamin (MULTIVITAMIN) tablet, Take 1 tablet by mouth daily., Disp: , Rfl:    Omega-3 Fatty Acids (FISH OIL PO), Take by mouth., Disp: , Rfl:    omeprazole (PRILOSEC) 20 MG capsule, Take 1 capsule (20 mg total) by mouth daily., Disp: 90 capsule, Rfl: 3  Social  History   Tobacco Use  Smoking Status Former   Types: Cigarettes   Quit date: 04/08/1991   Years since quitting: 30.1  Smokeless Tobacco Never  Tobacco Comments   OCCASIONAL SMOKER IN COLLEGE.    Allergies  Allergen Reactions   Eggs Or Egg-Derived Products     Causes abd pain and rash / can eat eggs mixed in food.   Objective:  There were no vitals filed for this visit. There is no height or weight on file to calculate BMI. Constitutional Well developed. Well nourished.  Vascular Dorsalis pedis pulses palpable bilaterally. Posterior tibial pulses palpable bilaterally. Capillary refill normal to all digits.  No cyanosis or clubbing noted. Pedal hair growth normal.  Neurologic Normal speech. Oriented to person, place, and time. Epicritic sensation to light touch grossly present bilaterally.  Dermatologic Painful ingrowing nail at medial nail borders of the hallux nail right. No other open wounds. No skin lesions.  Orthopedic: Normal joint ROM without pain or crepitus bilaterally. No visible deformities. No bony tenderness.   Radiographs: None Assessment:   1. Ingrown toenail of right foot    Plan:  Patient was evaluated and treated and all questions answered.  Ingrown Nail, right -Patient elects to proceed with minor surgery to remove ingrown toenail removal today.  Consent reviewed and signed by patient. -Ingrown nail excised. See procedure note. -Educated on post-procedure care including soaking. Written instructions provided and reviewed. -Patient to follow up in 2 weeks for nail check.  Procedure: Excision of Ingrown Toenail Location: Right 1st toe medial nail borders. Anesthesia: Lidocaine 1% plain; 1.5 mL and Marcaine 0.5% plain; 1.5 mL, digital block. Skin Prep: Betadine. Dressing: Silvadene; telfa; dry, sterile, compression dressing. Technique: Following skin prep, the toe was exsanguinated and a tourniquet was secured at the base of the toe. The affected  nail border was freed, split with a nail splitter, and excised. Chemical matrixectomy was then performed with phenol and irrigated out with alcohol. The tourniquet was then removed and sterile dressing applied. Disposition: Patient tolerated procedure well. Patient to return in 2 weeks for follow-up.   No follow-ups on file.

## 2021-08-02 ENCOUNTER — Other Ambulatory Visit: Payer: Self-pay

## 2021-08-02 ENCOUNTER — Encounter: Payer: Self-pay | Admitting: Nurse Practitioner

## 2021-08-02 ENCOUNTER — Telehealth: Payer: Self-pay

## 2021-08-02 ENCOUNTER — Ambulatory Visit: Payer: BC Managed Care – PPO | Admitting: Nurse Practitioner

## 2021-08-02 VITALS — BP 122/74 | HR 78 | Temp 97.2°F | Ht 66.75 in | Wt 141.8 lb

## 2021-08-02 DIAGNOSIS — Z0000 Encounter for general adult medical examination without abnormal findings: Secondary | ICD-10-CM | POA: Diagnosis not present

## 2021-08-02 DIAGNOSIS — E041 Nontoxic single thyroid nodule: Secondary | ICD-10-CM | POA: Diagnosis not present

## 2021-08-02 DIAGNOSIS — E559 Vitamin D deficiency, unspecified: Secondary | ICD-10-CM | POA: Diagnosis not present

## 2021-08-02 DIAGNOSIS — Z13220 Encounter for screening for lipoid disorders: Secondary | ICD-10-CM

## 2021-08-02 DIAGNOSIS — Z136 Encounter for screening for cardiovascular disorders: Secondary | ICD-10-CM

## 2021-08-02 NOTE — Patient Instructions (Signed)
You will be contacted to schedule appt for thyroid US  Go to [facility] for fasting blood draw. Need to be fasting 8hrs prior to blood draw. Ok to drink water.  Have PAP and mammogram report faxed to me once completed.  Preventive Care 65-52 Years Old, Female Preventive care refers to lifestyle choices and visits with your health care provider that can promote health and wellness. Preventive care visits are also called wellness exams. What can I expect for my preventive care visit? Counseling Your health care provider may ask you questions about your: Medical history, including: Past medical problems. Family medical history. Pregnancy history. Current health, including: Menstrual cycle. Method of birth control. Emotional well-being. Home life and relationship well-being. Sexual activity and sexual health. Lifestyle, including: Alcohol, nicotine or tobacco, and drug use. Access to firearms. Diet, exercise, and sleep habits. Work and work Statistician. Sunscreen use. Safety issues such as seatbelt and bike helmet use. Physical exam Your health care provider will check your: Height and weight. These may be used to calculate your BMI (body mass index). BMI is a measurement that tells if you are at a healthy weight. Waist circumference. This measures the distance around your waistline. This measurement also tells if you are at a healthy weight and may help predict your risk of certain diseases, such as type 2 diabetes and high blood pressure. Heart rate and blood pressure. Body temperature. Skin for abnormal spots. What immunizations do I need? Vaccines are usually given at various ages, according to a schedule. Your health care provider will recommend vaccines for you based on your age, medical history, and lifestyle or other factors, such as travel or where you work. What tests do I need? Screening Your health care provider may recommend screening tests for certain conditions. This  may include: Lipid and cholesterol levels. Diabetes screening. This is done by checking your blood sugar (glucose) after you have not eaten for a while (fasting). Pelvic exam and Pap test. Hepatitis B test. Hepatitis C test. HIV (human immunodeficiency virus) test. STI (sexually transmitted infection) testing, if you are at risk. Lung cancer screening. Colorectal cancer screening. Mammogram. Talk with your health care provider about when you should start having regular mammograms. This may depend on whether you have a family history of breast cancer. BRCA-related cancer screening. This may be done if you have a family history of breast, ovarian, tubal, or peritoneal cancers. Bone density scan. This is done to screen for osteoporosis. Talk with your health care provider about your test results, treatment options, and if necessary, the need for more tests. Follow these instructions at home: Eating and drinking  Eat a diet that includes fresh fruits and vegetables, whole grains, lean protein, and low-fat dairy products. Take vitamin and mineral supplements as recommended by your health care provider. Do not drink alcohol if: Your health care provider tells you not to drink. You are pregnant, may be pregnant, or are planning to become pregnant. If you drink alcohol: Limit how much you have to 0-1 drink a day. Know how much alcohol is in your drink. In the [place] one drink equals one 12 oz bottle of beer (355 mL), one 5 oz glass of wine (148 mL), or one 1 oz glass of hard liquor (44 mL). Lifestyle Brush your teeth every morning and night with fluoride toothpaste. Floss one time each day. Exercise for at least 30 minutes 5 or more days each week. Do not use any products that contain nicotine or tobacco. These  products include cigarettes, chewing tobacco, and vaping devices, such as e-cigarettes. If you need help quitting, ask your health care provider. Do not use drugs. If you are sexually  active, practice safe sex. Use a condom or other form of protection to prevent STIs. If you do not wish to become pregnant, use a form of birth control. If you plan to become pregnant, see your health care provider for a prepregnancy visit. Take aspirin only as told by your health care provider. Make sure that you understand how much to take and what form to take. Work with your health care provider to find out whether it is safe and beneficial for you to take aspirin daily. Find healthy ways to manage stress, such as: Meditation, yoga, or listening to music. Journaling. Talking to a trusted person. Spending time with friends and family. Minimize exposure to UV radiation to reduce your risk of skin cancer. Safety Always wear your seat belt while driving or riding in a vehicle. Do not drive: If you have been drinking alcohol. Do not ride with someone who has been drinking. When you are tired or distracted. While texting. If you have been using any mind-altering substances or drugs. Wear a helmet and other protective equipment during sports activities. If you have firearms in your house, make sure you follow all gun safety procedures. Seek help if you have been physically or sexually abused. What's next? Visit your health care provider once a year for an annual wellness visit. Ask your health care provider how often you should have your eyes and teeth checked. Stay up to date on all vaccines. This information is not intended to replace advice given to you by your health care provider. Make sure you discuss any questions you have with your health care provider. Document Revised: 01/03/2021 Document Reviewed: 01/03/2021 Elsevier Patient Education  [place].

## 2021-08-02 NOTE — Telephone Encounter (Signed)
Called and confirmed [place] appt with patient and gave correct address. Patient voiced understanding. Sw, cma

## 2021-08-02 NOTE — Progress Notes (Signed)
Subjective:    Patient ID: [name], female    DOB: Oct 30, 1970, 51 y.o.   MRN: [id number]  Patient presents today for CPE   HPI Denies any acute compliant.  Vision:will schedule Dental:up to date Diet:regular Exercise:yoga and walking 3-4x/week Weight:  Wt Readings from Last 3 Encounters:  08/02/21 141 lb 12.8 oz (64.3 kg)  04/24/20 140 lb (63.5 kg)  04/19/20 140 lb (63.5 kg)    Sexual History (orientation,birth control, marital status, STD):deferred pelvic and breast exam to GYN: Dr. Cletis Media Needs PAP and mammogram  Depression/Suicide: Depression screen [facility] 2/9 08/02/2021 08/31/2018  Decreased Interest 0 0  Down, Depressed, Hopeless 0 0  PHQ - 2 Score 0 0   Immunizations: (TDAP, Hep C screen, Pneumovax, Influenza, zoster)  Health Maintenance  Topic Date Due   Pap Smear  10/08/2015   COVID-19 Vaccine (5 - Booster) 12/07/2019   Mammogram  01/09/2020   Zoster (Shingles) Vaccine (1 of 2) 09/12/2021*   Hepatitis C Screening: USPSTF Recommendation to screen - Ages 18-79 yo.  08/02/2022*   Colon Cancer Screening  04/08/2023   Tetanus Vaccine  08/31/2028   Pneumococcal Vaccination  Aged Out   HPV Vaccine  Aged Out   Flu Shot  Discontinued   HIV Screening  Discontinued  *Topic was postponed. The date shown is not the original due date.   Fall Risk: Fall Risk  08/02/2021 08/31/2018  Falls in the past year? 0 0  Number falls in past yr: 0 -  Injury with Fall? 0 -  Risk for fall due to : No Fall Risks -  Follow up Falls evaluation completed Falls evaluation completed   Medications and allergies reviewed with patient and updated if appropriate.  Patient Active Problem List   Diagnosis Date Noted   Well woman exam without gynecological exam 08/31/2018   Dysphagia 08/31/2018   Family history of early CAD 08/31/2018   Chest pain 08/31/2018   Grief 08/31/2018   Menorrhagia 08/28/2018   Heart palpitations 05/23/2011   CHANGE IN BOWELS 11/28/2009   NONSPEC ABN FINDNG  RAD & OTH EXAM ABDOMINAL AREA 11/28/2009   COLONIC POLYPS, ADENOMATOUS, HX OF 04/21/2008    Current Outpatient Medications on File Prior to Visit  Medication Sig Dispense Refill   Ascorbic Acid (VITAMIN C PO) Take by mouth.     Cholecalciferol (VITAMIN D-3) 25 MCG (1000 UT) CAPS Take 2 capsules by mouth.     Multiple Vitamin (MULTIVITAMIN) tablet Take 1 tablet by mouth daily.     Omega-3 Fatty Acids (FISH OIL PO) Take by mouth.     No current facility-administered medications on file prior to visit.    Past Medical History:  Diagnosis Date   Cardiac murmur    As a child   Chronic headache    Colon polyps    Esophageal stricture    Foot fracture    GERD (gastroesophageal reflux disease)    Hiatal hernia    Uterine polyp     Past Surgical History:  Procedure Laterality Date   COLONOSCOPY     POLYPECTOMY     uterine   uterine polypectomy      Social History   Socioeconomic History   Marital status: Married    Spouse name: Not on file   Number of children: Not on file   Years of education: Not on file   Highest education level: Not on file  Occupational History   Not on file  Tobacco Use  Smoking status: Former    Types: Cigarettes    Quit date: 04/08/1991    Years since quitting: 30.3   Smokeless tobacco: Never   Tobacco comments:    [place].  Vaping Use   Vaping Use: Never used  Substance and Sexual Activity   Alcohol use: Yes    Alcohol/week: 2.0 standard drinks    Types: 2 Glasses of wine per week   Drug use: No   Sexual activity: Yes    Birth control/protection: Condom, Rhythm  Other Topics Concern   Not on file  Social History Narrative   Not on file   Social Determinants of Health   Financial Resource Strain: Not on file  Food Insecurity: Not on file  Transportation Needs: Not on file  Physical Activity: Not on file  Stress: Not on file  Social Connections: Not on file    Family History  Problem Relation Age of  Onset   Diabetes Father    Hyperlipidemia Father    Hypertension Father    Cancer Father        prostate CA   Heart disease Father 31       cause of death   Ovarian cancer Maternal Aunt    Colon polyps Maternal Aunt    Colon polyps Maternal Uncle    Cancer Paternal Aunt 56       breast   Breast cancer Paternal Aunt    Colon cancer Neg Hx    Esophageal cancer Neg Hx    Rectal cancer Neg Hx    Stomach cancer Neg Hx        Review of Systems  Constitutional:  Negative for fever, malaise/fatigue and weight loss.  HENT:  Negative for congestion and sore throat.   Eyes:        Negative for visual changes  Respiratory:  Negative for cough and shortness of breath.   Cardiovascular:  Negative for chest pain, palpitations and leg swelling.  Gastrointestinal:  Negative for blood in stool, constipation, diarrhea and heartburn.  Genitourinary:  Negative for dysuria, frequency and urgency.  Musculoskeletal:  Negative for falls, joint pain and myalgias.  Skin:  Negative for rash.  Neurological:  Negative for dizziness, sensory change and headaches.  Endo/Heme/Allergies:  Does not bruise/bleed easily.  Psychiatric/Behavioral:  Negative for depression, substance abuse and suicidal ideas. The patient is not nervous/anxious.    Objective:   Vitals:   08/02/21 1252  BP: 122/74  Pulse: 78  Temp: (!) 97.2 F (36.2 C)  SpO2: 99%   Body mass index is 22.38 kg/m.  Physical Examination:  Physical Exam Vitals reviewed.  Constitutional:      General: She is not in acute distress.    Appearance: She is well-developed.  HENT:     Right Ear: Tympanic membrane, ear canal and external ear normal.     Left Ear: Tympanic membrane, ear canal and external ear normal.     Nose: Nose normal.  Eyes:     Extraocular Movements: Extraocular movements intact.     Conjunctiva/sclera: Conjunctivae normal.     Pupils: Pupils are equal, round, and reactive to light.  Neck:     Thyroid: Thyroid mass  present. No thyromegaly or thyroid tenderness.  Cardiovascular:     Rate and Rhythm: Normal rate and regular rhythm.     Pulses: Normal pulses.     Heart sounds: Normal heart sounds.  Pulmonary:     Effort: Pulmonary effort is normal. No respiratory  distress.     Breath sounds: Normal breath sounds.  Chest:     Chest wall: No tenderness.  Abdominal:     General: Bowel sounds are normal.     Palpations: Abdomen is soft.  Genitourinary:    Comments: Deferred breast and pelvic exam to GYN per patient Musculoskeletal:        General: Normal range of motion.     Cervical back: Normal range of motion and neck supple.     Right lower leg: No edema.     Left lower leg: No edema.  Lymphadenopathy:     Cervical: No cervical adenopathy.  Skin:    General: Skin is warm and dry.  Neurological:     Mental Status: She is alert and oriented to person, place, and time.     Deep Tendon Reflexes: Reflexes are normal and symmetric.  Psychiatric:        Mood and Affect: Mood normal.        Behavior: Behavior normal.        Thought Content: Thought content normal.   ASSESSMENT and PLAN: This visit occurred during the SARS-CoV-2 public health emergency.  Safety protocols were in place, including screening questions prior to the visit, additional usage of staff PPE, and extensive cleaning of exam room while observing appropriate contact time as indicated for disinfecting solutions.   [place] was seen today for establish care.  Diagnoses and all orders for this visit:  Well woman exam without gynecological exam -     Comprehensive metabolic panel; Future -     CBC with Differential/Platelet; Future -     Lipid panel; Future  Thyroid nodule -     US THYROID; Future -     Thyroid Panel With TSH; Future  Vitamin D deficiency -     Vitamin D (25 hydroxy); Future  Encounter for lipid screening for cardiovascular disease -     Lipid panel; Future      Problem List Items Addressed This Visit        Other   Well woman exam without gynecological exam - Primary   Relevant Orders   Comprehensive metabolic panel   CBC with Differential/Platelet   Lipid panel   Other Visit Diagnoses     Thyroid nodule       Relevant Orders   US THYROID   Thyroid Panel With TSH   Vitamin D deficiency       Relevant Orders   Vitamin D (25 hydroxy)   Encounter for lipid screening for cardiovascular disease       Relevant Orders   Lipid panel       Follow up: Return in about 1 year (around 08/02/2022) for CPE (fasting).  Wilfred Lacy, NP

## 2021-08-07 ENCOUNTER — Other Ambulatory Visit: Payer: Self-pay | Admitting: Nurse Practitioner

## 2021-08-08 LAB — COMPREHENSIVE METABOLIC PANEL
ALT: 19 IU/L (ref 0–32)
AST: 22 IU/L (ref 0–40)
Albumin/Globulin Ratio: 1.7 (ref 1.2–2.2)
Albumin: 4.2 g/dL (ref 3.8–4.9)
Alkaline Phosphatase: 79 IU/L (ref 44–121)
BUN/Creatinine Ratio: 18 (ref 9–23)
BUN: 14 mg/dL (ref 6–24)
Bilirubin Total: 0.3 mg/dL (ref 0.0–1.2)
CO2: 22 mmol/L (ref 20–29)
Calcium: 9.3 mg/dL (ref 8.7–10.2)
Chloride: 104 mmol/L (ref 96–106)
Creatinine, Ser: 0.8 mg/dL (ref 0.57–1.00)
Globulin, Total: 2.5 g/dL (ref 1.5–4.5)
Glucose: 88 mg/dL (ref 70–99)
Potassium: 4.1 mmol/L (ref 3.5–5.2)
Sodium: 140 mmol/L (ref 134–144)
Total Protein: 6.7 g/dL (ref 6.0–8.5)
eGFR: 89 mL/min/{1.73_m2} (ref >59)

## 2021-08-08 LAB — CBC WITH DIFFERENTIAL/PLATELET
Basophils Absolute: 0 10*3/uL (ref 0.0–0.2)
Basos: 1 %
EOS (ABSOLUTE): 0.2 10*3/uL (ref 0.0–0.4)
Eos: 3 %
Hematocrit: 41.1 % (ref 34.0–46.6)
Hemoglobin: 14 g/dL (ref 11.1–15.9)
Immature Grans (Abs): 0 10*3/uL (ref 0.0–0.1)
Immature Granulocytes: 0 %
Lymphocytes Absolute: 2.9 10*3/uL (ref 0.7–3.1)
Lymphs: 52 %
MCH: 33.2 pg — ABNORMAL HIGH (ref 26.6–33.0)
MCHC: 34.1 g/dL (ref 31.5–35.7)
MCV: 97 fL (ref 79–97)
Monocytes Absolute: 0.6 10*3/uL (ref 0.1–0.9)
Monocytes: 10 %
Neutrophils Absolute: 1.9 10*3/uL (ref 1.4–7.0)
Neutrophils: 34 %
Platelets: 346 10*3/uL (ref 150–450)
RBC: 4.22 x10E6/uL (ref 3.77–5.28)
RDW: 11.7 % (ref 11.7–15.4)
WBC: 5.5 10*3/uL (ref 3.4–10.8)

## 2021-08-08 LAB — VITAMIN D 25 HYDROXY (VIT D DEFICIENCY, FRACTURES): Vit D, 25-Hydroxy: 102 ng/mL — ABNORMAL HIGH (ref 30.0–100.0)

## 2021-08-08 LAB — LIPID PANEL
Chol/HDL Ratio: 2.3 ratio (ref 0.0–4.4)
Cholesterol, Total: 198 mg/dL (ref 100–199)
HDL: 87 mg/dL (ref >39)
LDL Chol Calc (NIH): 98 mg/dL (ref 0–99)
Triglycerides: 71 mg/dL (ref 0–149)
VLDL Cholesterol Cal: 13 mg/dL (ref 5–40)

## 2021-08-08 LAB — THYROID PANEL WITH TSH
Free Thyroxine Index: 2.4 (ref 1.2–4.9)
T3 Uptake Ratio: 30 % (ref 24–39)
T4, Total: 8 ug/dL (ref 4.5–12.0)
TSH: 2.13 u[IU]/mL (ref 0.450–4.500)

## 2021-08-09 ENCOUNTER — Ambulatory Visit
Admission: RE | Admit: 2021-08-09 | Discharge: 2021-08-09 | Disposition: A | Discharge status: Home / Self Care | Payer: BC Managed Care – PPO | Source: Ambulatory Visit | Attending: Nurse Practitioner | Admitting: Nurse Practitioner

## 2021-08-09 ENCOUNTER — Other Ambulatory Visit: Payer: Self-pay

## 2021-08-09 DIAGNOSIS — E041 Nontoxic single thyroid nodule: Secondary | ICD-10-CM | POA: Diagnosis present

## 2021-08-27 LAB — HM MAMMOGRAPHY

## 2021-09-01 LAB — HM PAP SMEAR

## 2022-04-11 ENCOUNTER — Other Ambulatory Visit: Payer: Self-pay | Admitting: Obstetrics and Gynecology

## 2022-04-11 DIAGNOSIS — N6320 Unspecified lump in the left breast, unspecified quadrant: Secondary | ICD-10-CM

## 2022-06-04 IMAGING — US US THYROID
1 series · 13 of 25 positions shown · non-contrast
Comparison: None.

CLINICAL DATA: Thyroid nodule on exam

EXAM:
THYROID ULTRASOUND
TECHNIQUE: Ultrasound examination of the thyroid gland and adjacent soft
tissues was performed.

[Series 1: us thyroid · 0.05mm/px · 13 of 51 slices shown]
[im 1/51]
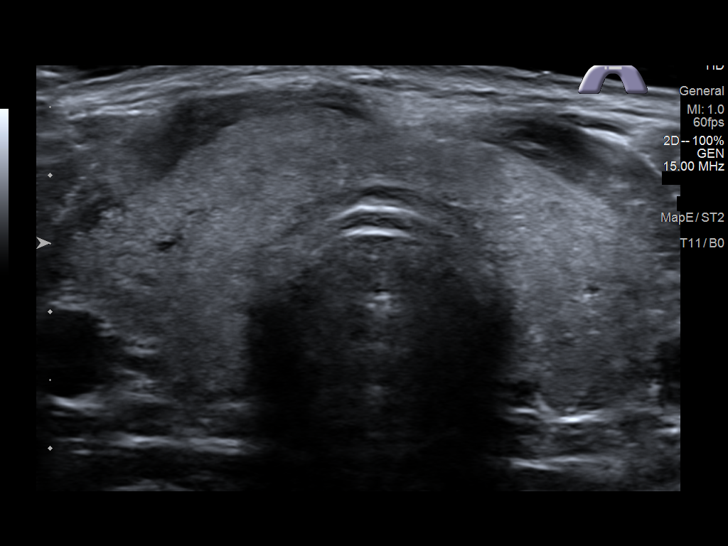
[im 5/51]
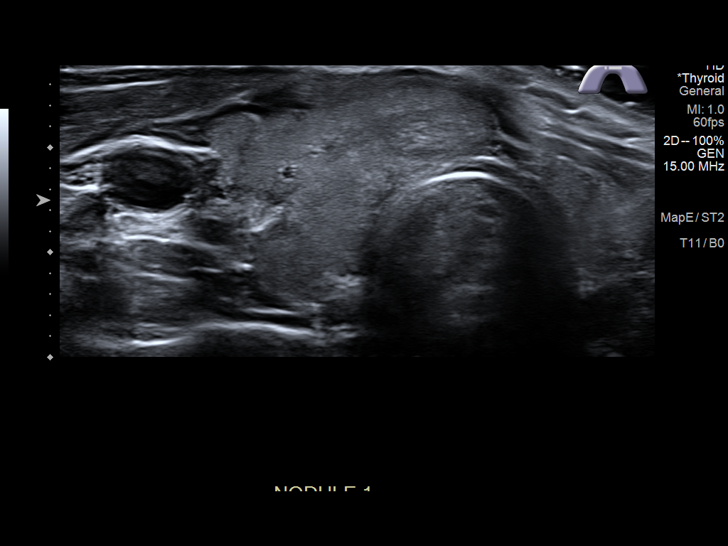
[im 9/51]
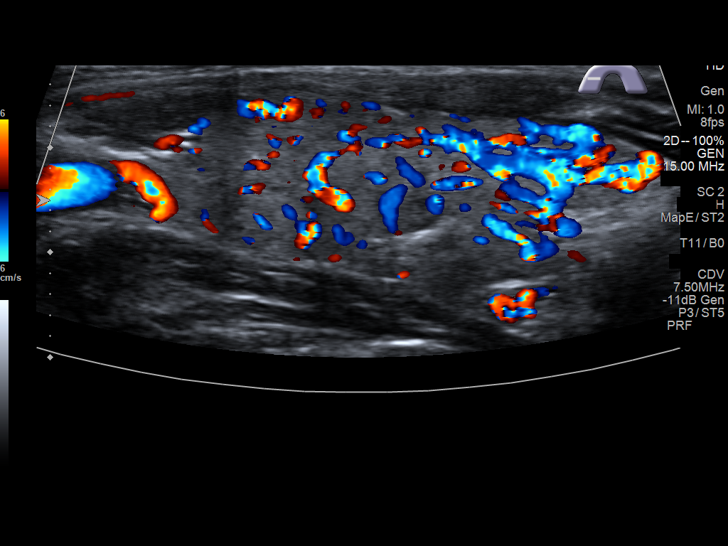
[im 13/51]
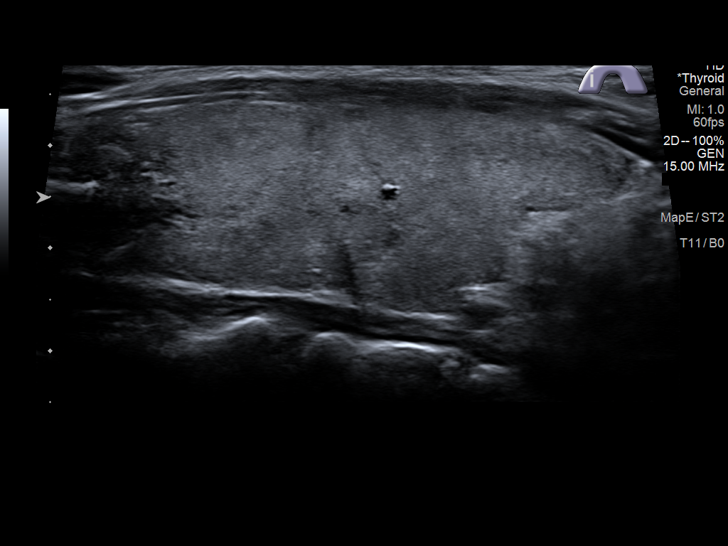
[im 17/51]
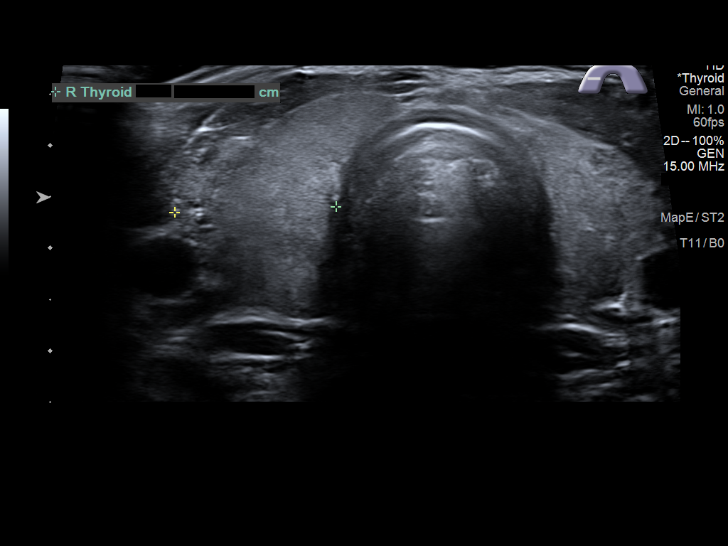
[im 21/51]
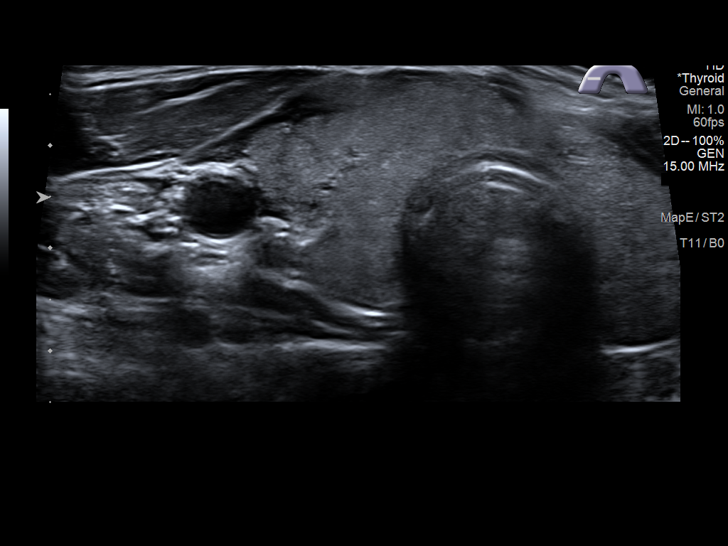
[im 26/51]
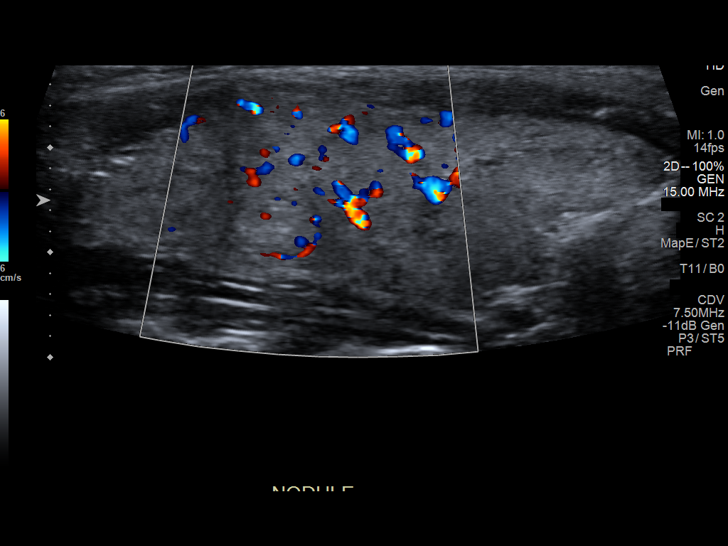
[im 30/51]
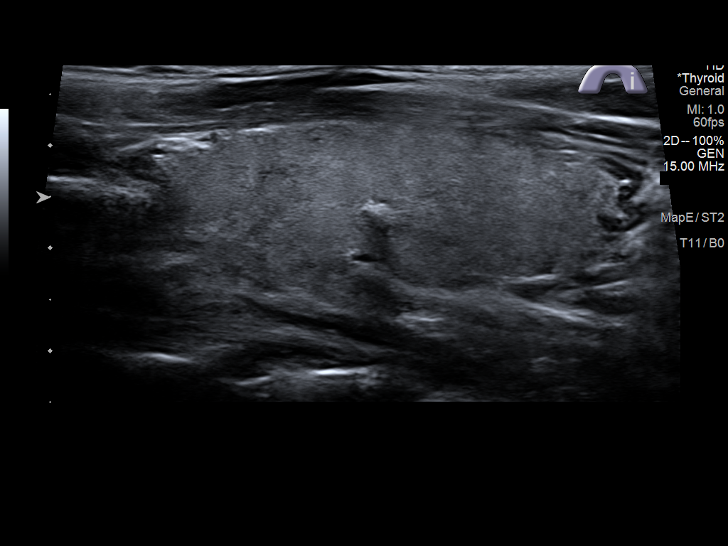
[im 34/51]
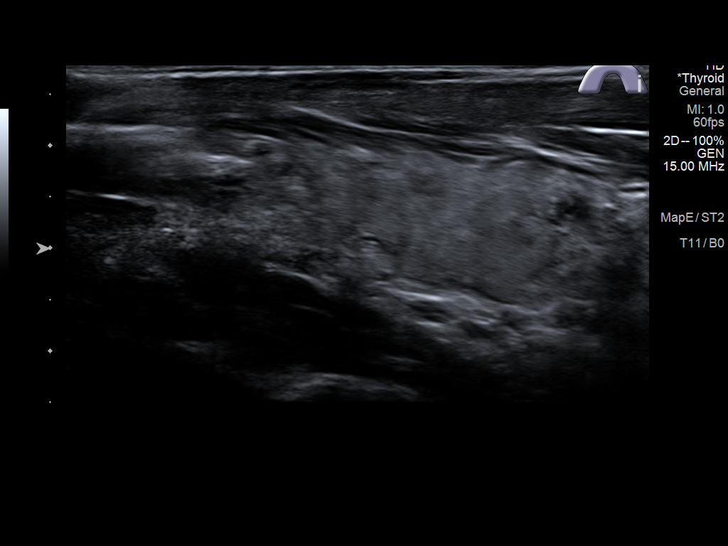
[im 38/51]
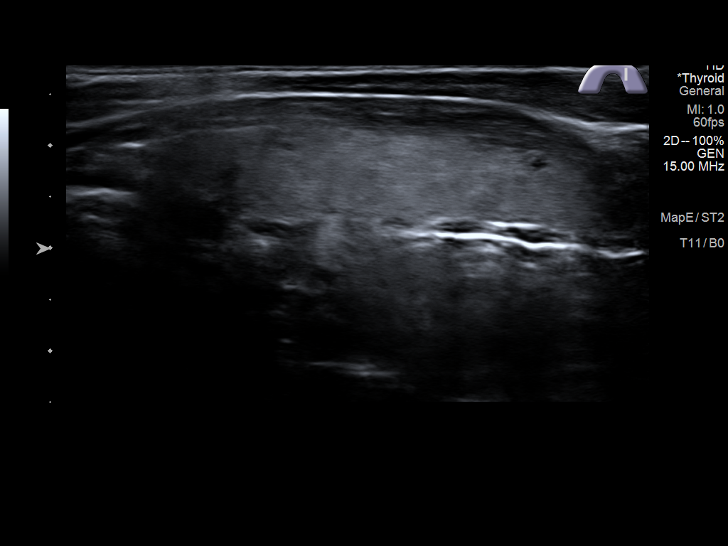
[im 42/51]
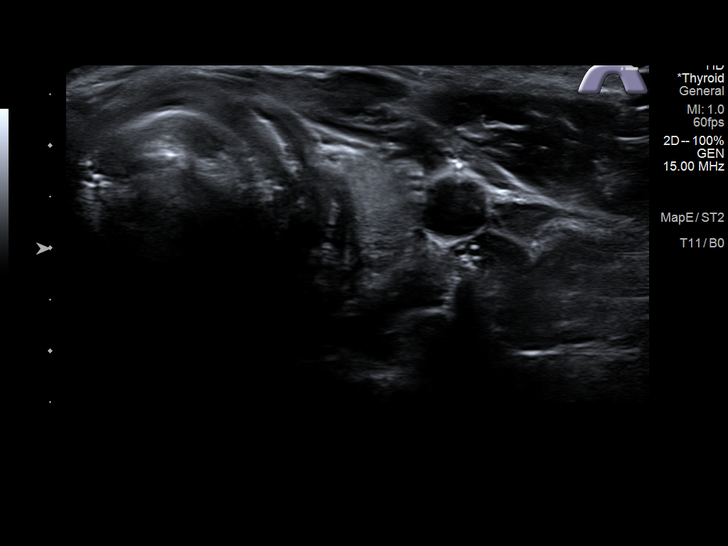
[im 46/51]
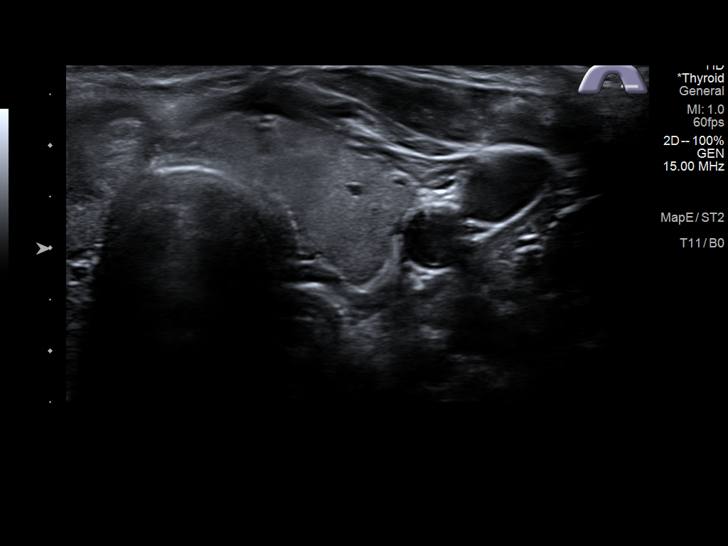
[im 51/51]
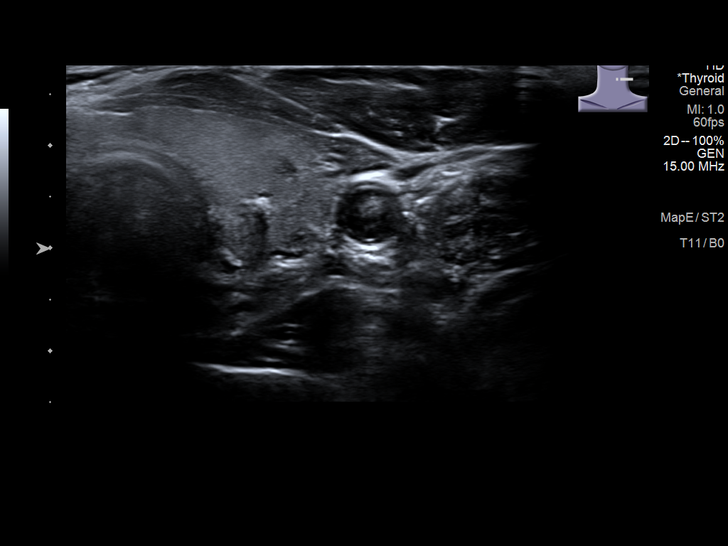

[13 of 25 positions shown; findings below may reference images not displayed]

FINDINGS: Parenchymal Echotexture: Moderately heterogenous

Isthmus: 4.3 mm

Right lobe: 5.4 x 2.1 x 1.6 cm

Left lobe: 4.6 x 1.8 x 1.4 cm

_________________________________________________________

Estimated total number of nodules >/= 1 cm: 1

Number of spongiform nodules >/=  2 cm not described below (TR1): 0

Number of mixed cystic and solid nodules >/= 1.5 cm not described
below (TR2): 0

_________________________________________________________

Nodule # 1:

Location: Isthmus; Mid

Maximum size: 1.0 cm; Other 2 dimensions: 1.0 x 0.5 cm

Composition: solid/almost completely solid (2)

Echogenicity: isoechoic (1)

Shape: not taller-than-wide (0)

Margins: ill-defined (0)

Echogenic foci: none (0)

ACR TI-RADS total points: 3.

ACR TI-RADS risk category: TR3 (3 points).

ACR TI-RADS recommendations:

Given size (<1.4 cm) and appearance, this nodule does NOT meet
TI-RADS criteria for biopsy or dedicated follow-up.

_________________________________________________________

There is an additional right mid thyroid subcentimeter solid
isoechoic TR 3 type nodule measures only 9 mm. This also would not
meet criteria for any biopsy or follow-up and is not fully detailed
by TI rads criteria.

Left mid thyroid subcentimeter dystrophic calcification. No other
significant left thyroid abnormality. No hypervascularity. No
regional adenopathy.
IMPRESSION: Nonspecific thyroid heterogeneity.

1 cm mid isthmus TR 3 nodule does not meet criteria for biopsy or
follow-up.

Additional findings as above.

The above is in keeping with the ACR TI-RADS recommendations - [HOSPITAL] 8796;[DATE].

## 2022-08-13 ENCOUNTER — Encounter: Payer: Self-pay | Admitting: Nurse Practitioner

## 2022-08-13 ENCOUNTER — Ambulatory Visit (INDEPENDENT_AMBULATORY_CARE_PROVIDER_SITE_OTHER): Payer: BC Managed Care – PPO | Admitting: Nurse Practitioner

## 2022-08-13 VITALS — BP 108/68 | HR 66 | Temp 98.0°F | Ht 67.0 in | Wt 146.2 lb

## 2022-08-13 DIAGNOSIS — E559 Vitamin D deficiency, unspecified: Secondary | ICD-10-CM | POA: Insufficient documentation

## 2022-08-13 DIAGNOSIS — Z0000 Encounter for general adult medical examination without abnormal findings: Secondary | ICD-10-CM | POA: Diagnosis not present

## 2022-08-13 DIAGNOSIS — E041 Nontoxic single thyroid nodule: Secondary | ICD-10-CM | POA: Insufficient documentation

## 2022-08-13 LAB — COMPREHENSIVE METABOLIC PANEL
ALT: 20 U/L (ref 0–35)
AST: 21 U/L (ref 0–37)
Albumin: 4.3 g/dL (ref 3.5–5.2)
Alkaline Phosphatase: 73 U/L (ref 39–117)
BUN: 14 mg/dL (ref 6–23)
CO2: 28 mEq/L (ref 19–32)
Calcium: 9.6 mg/dL (ref 8.4–10.5)
Chloride: 103 mEq/L (ref 96–112)
Creatinine, Ser: 0.77 mg/dL (ref 0.40–1.20)
GFR: 88.58 mL/min (ref >60.00)
Glucose, Bld: 93 mg/dL (ref 70–99)
Potassium: 4 mEq/L (ref 3.5–5.1)
Sodium: 139 mEq/L (ref 135–145)
Total Bilirubin: 0.4 mg/dL (ref 0.2–1.2)
Total Protein: 6.6 g/dL (ref 6.0–8.3)

## 2022-08-13 LAB — CBC
HCT: 39.3 % (ref 36.0–46.0)
Hemoglobin: 13.4 g/dL (ref 12.0–15.0)
MCHC: 34.1 g/dL (ref 30.0–36.0)
MCV: 99.1 fl (ref 78.0–100.0)
Platelets: 351 10*3/uL (ref 150.0–400.0)
RBC: 3.97 Mil/uL (ref 3.87–5.11)
RDW: 12.2 % (ref 11.5–15.5)
WBC: 6.1 10*3/uL (ref 4.0–10.5)

## 2022-08-13 LAB — VITAMIN D 25 HYDROXY (VIT D DEFICIENCY, FRACTURES): VITD: 53.44 ng/mL (ref 30.00–100.00)

## 2022-08-13 NOTE — Patient Instructions (Addendum)
Go to lab Sign medical release to get records from GYN  Preventive Care 40-53 Years Old, Female Preventive care refers to lifestyle choices and visits with your health care provider that can promote health and wellness. Preventive care visits are also called wellness exams. What can I expect for my preventive care visit? Counseling Your health care provider may ask you questions about your: Medical history, including: Past medical problems. Family medical history. Pregnancy history. Current health, including: Menstrual cycle. Method of birth control. Emotional well-being. Home life and relationship well-being. Sexual activity and sexual health. Lifestyle, including: Alcohol, nicotine or tobacco, and drug use. Access to firearms. Diet, exercise, and sleep habits. Work and work environment. Sunscreen use. Safety issues such as seatbelt and bike helmet use. Physical exam Your health care provider will check your: Height and weight. These may be used to calculate your BMI (body mass index). BMI is a measurement that tells if you are at a healthy weight. Waist circumference. This measures the distance around your waistline. This measurement also tells if you are at a healthy weight and may help predict your risk of certain diseases, such as type 2 diabetes and high blood pressure. Heart rate and blood pressure. Body temperature. Skin for abnormal spots. What immunizations do I need?  Vaccines are usually given at various ages, according to a schedule. Your health care provider will recommend vaccines for you based on your age, medical history, and lifestyle or other factors, such as travel or where you work. What tests do I need? Screening Your health care provider may recommend screening tests for certain conditions. This may include: Lipid and cholesterol levels. Diabetes screening. This is done by checking your blood sugar (glucose) after you have not eaten for a while  (fasting). Pelvic exam and Pap test. Hepatitis B test. Hepatitis C test. HIV (human immunodeficiency virus) test. STI (sexually transmitted infection) testing, if you are at risk. Lung cancer screening. Colorectal cancer screening. Mammogram. Talk with your health care provider about when you should start having regular mammograms. This may depend on whether you have a family history of breast cancer. BRCA-related cancer screening. This may be done if you have a family history of breast, ovarian, tubal, or peritoneal cancers. Bone density scan. This is done to screen for osteoporosis. Talk with your health care provider about your test results, treatment options, and if necessary, the need for more tests. Follow these instructions at home: Eating and drinking  Eat a diet that includes fresh fruits and vegetables, whole grains, lean protein, and low-fat dairy products. Take vitamin and mineral supplements as recommended by your health care provider. Do not drink alcohol if: Your health care provider tells you not to drink. You are pregnant, may be pregnant, or are planning to become pregnant. If you drink alcohol: Limit how much you have to 0-1 drink a day. Know how much alcohol is in your drink. In the [place] one drink equals one 12 oz bottle of beer (355 mL), one 5 oz glass of wine (148 mL), or one 1 oz glass of hard liquor (44 mL). Lifestyle Brush your teeth every morning and night with fluoride toothpaste. Floss one time each day. Exercise for at least 30 minutes 5 or more days each week. Do not use any products that contain nicotine or tobacco. These products include cigarettes, chewing tobacco, and vaping devices, such as e-cigarettes. If you need help quitting, ask your health care provider. Do not use drugs. If you are sexually active,   practice safe sex. Use a condom or other form of protection to prevent STIs. If you do not wish to become pregnant, use a form of birth control. If  you plan to become pregnant, see your health care provider for a prepregnancy visit. Take aspirin only as told by your health care provider. Make sure that you understand how much to take and what form to take. Work with your health care provider to find out whether it is safe and beneficial for you to take aspirin daily. Find healthy ways to manage stress, such as: Meditation, yoga, or listening to music. Journaling. Talking to a trusted person. Spending time with friends and family. Minimize exposure to UV radiation to reduce your risk of skin cancer. Safety Always wear your seat belt while driving or riding in a vehicle. Do not drive: If you have been drinking alcohol. Do not ride with someone who has been drinking. When you are tired or distracted. While texting. If you have been using any mind-altering substances or drugs. Wear a helmet and other protective equipment during sports activities. If you have firearms in your house, make sure you follow all gun safety procedures. Seek help if you have been physically or sexually abused. What's next? Visit your health care provider once a year for an annual wellness visit. Ask your health care provider how often you should have your eyes and teeth checked. Stay up to date on all vaccines. This information is not intended to replace advice given to you by your health care provider. Make sure you discuss any questions you have with your health care provider. Document Revised: 01/03/2021 Document Reviewed: 01/03/2021 Elsevier Patient Education  [place].

## 2022-08-13 NOTE — Assessment & Plan Note (Signed)
Thyroid US 2023: Nonspecific thyroid heterogeneity. 1 cm mid isthmus TR 3 nodule does not meet criteria for biopsy or follow-up. Normal thyroid panel

## 2022-08-13 NOTE — Progress Notes (Signed)
Complete physical exam  Patient: [name]   DOB: [birth year]   52 y.o. Female  MRN: [id number] Visit Date: 08/13/2022  Subjective:    Chief Complaint  Patient presents with   Annual Exam    CPE. Not fasting. No concerns.   [name] is a 52 y.o. female who presents today for a complete physical exam. She reports consuming a low fat and low sodium diet.  Walking daily and yoga 4x/week  She generally feels well. She reports sleeping well. She does not have additional problems to discuss today.  Vision:NO Dental:Yes STD Screen:No  BP Readings from Last 3 Encounters:  08/13/22 108/68  08/02/21 122/74  08/31/18 (!) 102/58    Wt Readings from Last 3 Encounters:  08/13/22 146 lb 3.2 oz (66.3 kg)  08/02/21 141 lb 12.8 oz (64.3 kg)  04/24/20 140 lb (63.5 kg)    Most recent fall risk assessment:    08/13/2022    1:15 PM  Fall Risk   Falls in the past year? 0  Number falls in past yr: 0  Injury with Fall? 0     Depression screen:Yes - No Depression  Most recent depression screenings:    08/13/2022    1:15 PM 08/02/2021    1:33 PM  PHQ 2/9 Scores  PHQ - 2 Score 0 2  PHQ- 9 Score  2    HPI  Thyroid nodule Thyroid US 2023: Nonspecific thyroid heterogeneity. 1 cm mid isthmus TR 3 nodule does not meet criteria for biopsy or follow-up. Normal thyroid panel   Past Medical History:  Diagnosis Date   Cardiac murmur    As a child   Chronic headache    Colon polyps    Esophageal stricture    Foot fracture    GERD (gastroesophageal reflux disease)    Hiatal hernia    Uterine polyp    Past Surgical History:  Procedure Laterality Date   COLONOSCOPY     POLYPECTOMY     uterine   uterine polypectomy     Social History   Socioeconomic History   Marital status: Married    Spouse name: Not on file   Number of children: 2   Years of education: Not on file   Highest education level: Not on file  Occupational History   Not on file  Tobacco Use   Smoking  status: Former    Types: Cigarettes    Quit date: 04/08/1991    Years since quitting: 31.3   Smokeless tobacco: Never   Tobacco comments:    [place].  Vaping Use   Vaping Use: Never used  Substance and Sexual Activity   Alcohol use: Yes    Alcohol/week: 2.0 standard drinks of alcohol    Types: 2 Glasses of wine per week   Drug use: No   Sexual activity: Yes    Birth control/protection: Condom, Rhythm  Other Topics Concern   Not on file  Social History Narrative   Not on file   Social Determinants of Health   Financial Resource Strain: Not on file  Food Insecurity: Not on file  Transportation Needs: Not on file  Physical Activity: Not on file  Stress: Not on file  Social Connections: Not on file  Intimate Partner Violence: Not on file   Family Status  Relation Name Status   Mother  Alive   Father  Deceased   Mat Aunt  (Not Specified)   Administrator  (Not  Specified)   Ethlyn Daniels  (Not Specified)   Neg Hx  (Not Specified)   Family History  Problem Relation Age of Onset   Diabetes Father    Hyperlipidemia Father    Hypertension Father    Cancer Father        prostate CA   Heart disease Father 67       cause of death   Ovarian cancer Maternal Aunt    Colon polyps Maternal Aunt    Colon polyps Maternal Uncle    Cancer Paternal Aunt 20       breast   Breast cancer Paternal Aunt    Colon cancer Neg Hx    Esophageal cancer Neg Hx    Rectal cancer Neg Hx    Stomach cancer Neg Hx    Allergies  Allergen Reactions   Eggs Or Egg-Derived Products     Causes abd pain and rash / can eat eggs mixed in food.    Patient Care Team: Kea Callan, Charlene Brooke, NP as PCP - General (Internal Medicine)   Medications: Outpatient Medications Prior to Visit  Medication Sig   Ascorbic Acid (VITAMIN C PO) Take by mouth.   Cholecalciferol (VITAMIN D-3) 25 MCG (1000 UT) CAPS Take 2 capsules by mouth.   Multiple Vitamin (MULTIVITAMIN) tablet Take 1 tablet by mouth  daily.   Omega-3 Fatty Acids (FISH OIL PO) Take by mouth.   No facility-administered medications prior to visit.    Review of Systems  Constitutional:  Negative for fever.  HENT:  Negative for congestion and sore throat.   Eyes:        Negative for visual changes  Respiratory:  Negative for cough and shortness of breath.   Cardiovascular:  Negative for chest pain, palpitations and leg swelling.  Gastrointestinal:  Negative for blood in stool, constipation and diarrhea.  Genitourinary:  Negative for dysuria, frequency and urgency.  Musculoskeletal:  Negative for myalgias.  Skin:  Negative for rash.  Neurological:  Negative for dizziness and headaches.  Hematological:  Does not bruise/bleed easily.  Psychiatric/Behavioral:  Negative for suicidal ideas. The patient is not nervous/anxious.         Objective:  BP 108/68 (BP Location: Right Arm, Patient Position: Sitting)   Pulse 66   Temp 98 F (36.7 C) (Temporal)   Ht '5\' 7"'$  (1.702 m)   Wt 146 lb 3.2 oz (66.3 kg)   LMP 08/10/2022 (Exact Date)   SpO2 98%   BMI 22.90 kg/m     Physical Exam Vitals reviewed.  Constitutional:      General: She is not in acute distress.    Appearance: She is well-developed. She is obese.  HENT:     Right Ear: Tympanic membrane, ear canal and external ear normal.     Left Ear: Tympanic membrane, ear canal and external ear normal.     Nose: Nose normal.     Mouth/Throat:     Pharynx: No oropharyngeal exudate.  Eyes:     Extraocular Movements: Extraocular movements intact.     Conjunctiva/sclera: Conjunctivae normal.     Pupils: Pupils are equal, round, and reactive to light.  Cardiovascular:     Rate and Rhythm: Normal rate and regular rhythm.     Pulses: Normal pulses.     Heart sounds: Normal heart sounds.  Pulmonary:     Effort: Pulmonary effort is normal. No respiratory distress.     Breath sounds: Normal breath sounds.  Chest:     Chest  wall: No tenderness.  Abdominal:      General: Bowel sounds are normal.     Palpations: Abdomen is soft.  Genitourinary:    Comments: Deferred breast and pelvic exam to GYN Musculoskeletal:        General: Normal range of motion.     Cervical back: Normal range of motion and neck supple.     Right lower leg: No edema.     Left lower leg: No edema.  Lymphadenopathy:     Cervical: No cervical adenopathy.  Skin:    General: Skin is warm and dry.  Neurological:     Mental Status: She is alert and oriented to person, place, and time.     Deep Tendon Reflexes: Reflexes are normal and symmetric.  Psychiatric:        Mood and Affect: Mood normal.        Behavior: Behavior normal.        Thought Content: Thought content normal.     No results found for any visits on 08/13/22.    Assessment & Plan:    Routine Health Maintenance and Physical Exam  Immunization History  Administered Date(s) Administered   Influenza,inj,Quad PF,6+ Mos 04/15/2016   PFIZER(Purple Top)SARS-COV-2 Vaccination 09/19/2019, 10/12/2019   PPD Test 04/15/2016   Tdap 08/31/2018   Unspecified SARS-COV-2 Vaccination 08/23/2019, 09/20/2019   Health Maintenance  Topic Date Due   Hepatitis C Screening  Never done   PAP SMEAR-Modifier  10/08/2015   MAMMOGRAM  01/09/2020   Zoster Vaccines- Shingrix (1 of 2) Never done   COVID-19 Vaccine (5 - 2023-24 season) 08/29/2022 (Originally 03/22/2022)   COLONOSCOPY (Pts 45-71yr Insurance coverage will need to be confirmed)  04/08/2023   DTaP/Tdap/Td (2 - Td or Tdap) 08/31/2028   HPV VACCINES  Aged Out   INFLUENZA VACCINE  Discontinued   HIV Screening  Discontinued   Discussed health benefits of physical activity, and encouraged her to engage in regular exercise appropriate for her age and condition. Requested PAP amd mammogram report. Advised to schedule appt for repeat colonoscopy 03/2023  Problem List Items Addressed This Visit       Other   Vitamin D deficiency   Relevant Orders   VITAMIN D 25 Hydroxy  (Vit-D Deficiency, Fractures)   Other Visit Diagnoses     Preventative health care    -  Primary   Relevant Orders   Comprehensive metabolic panel   CBC      Return in about 1 year (around 08/14/2023) for CPE (fasting).     CWilfred Lacy NP

## 2023-02-26 ENCOUNTER — Encounter: Payer: Self-pay | Admitting: Gastroenterology

## 2023-02-26 ENCOUNTER — Encounter: Payer: Self-pay | Admitting: Nurse Practitioner

## 2023-02-26 DIAGNOSIS — R1013 Epigastric pain: Secondary | ICD-10-CM

## 2023-02-27 MED ORDER — OMEPRAZOLE 20 MG PO CPDR: 20.0000 mg | DELAYED_RELEASE_CAPSULE | Freq: Two times a day (BID) | ORAL | 0 refills | Status: DC

## 2023-05-14 ENCOUNTER — Ambulatory Visit: Payer: BC Managed Care – PPO | Admitting: Gastroenterology

## 2023-05-30 ENCOUNTER — Encounter: Payer: Self-pay | Admitting: Gastroenterology

## 2023-06-04 ENCOUNTER — Encounter: Payer: Self-pay | Admitting: Gastroenterology

## 2023-06-04 ENCOUNTER — Ambulatory Visit: Payer: BC Managed Care – PPO | Admitting: Gastroenterology

## 2023-06-04 ENCOUNTER — Telehealth: Payer: Self-pay

## 2023-06-04 VITALS — BP 116/70 | HR 73 | Ht 67.0 in | Wt 146.6 lb

## 2023-06-04 DIAGNOSIS — R1310 Dysphagia, unspecified: Secondary | ICD-10-CM

## 2023-06-04 DIAGNOSIS — Z860100 Personal history of colon polyps, unspecified: Secondary | ICD-10-CM | POA: Diagnosis not present

## 2023-06-04 MED ORDER — NA SULFATE-K SULFATE-MG SULF 17.5-3.13-1.6 GM/177ML PO SOLN: 1.0000 | Freq: Once | ORAL | 0 refills | Status: AC

## 2023-06-04 NOTE — Telephone Encounter (Signed)
Patient left a voicemail and has some questions for our office. Return patient call she states she is a current patient of [facility] but her doctor is retiring. She states that she lives in [place] and a friend recommend that she transfer her care to our office. She states she has had history of colon polyps and esophageal Strictures. She states she is due for a colonoscopy and EGD. Before she request a referral she wants to know if you all will accept the transfer./

## 2023-06-04 NOTE — Telephone Encounter (Signed)
Called and left a message for call back  

## 2023-06-04 NOTE — Patient Instructions (Addendum)
You have been scheduled for an endoscopy and colonoscopy. Please follow the written instructions given to you at your visit today.  Please pick up your prep supplies at the pharmacy within the next 1-3 days.  If you use inhalers (even only as needed), please bring them with you on the day of your procedure.  DO NOT TAKE 7 DAYS PRIOR TO TEST- Trulicity (dulaglutide) Ozempic, Wegovy (semaglutide) Mounjaro (tirzepatide) Bydureon Bcise (exanatide extended release)  DO NOT TAKE 1 DAY PRIOR TO YOUR TEST Rybelsus (semaglutide) Adlyxin (lixisenatide) Victoza (liraglutide) Byetta (exanatide) _______________________________________________________  If your blood pressure at your visit was 140/90 or greater, please contact your primary care physician to follow up on this.  _______________________________________________________  If you are age 85 or older, your body mass index should be between 23-30. Your Body mass index is 22.96 kg/m. If this is out of the aforementioned range listed, please consider follow up with your Primary Care Provider.  If you are age 63 or younger, your body mass index should be between 19-25. Your Body mass index is 22.96 kg/m. If this is out of the aformentioned range listed, please consider follow up with your Primary Care Provider.   ________________________________________________________  The [facility] providers would like to encourage you to use [facility] to communicate with providers for non-urgent requests or questions.  Due to long hold times on the telephone, sending your provider a message by [facility] may be a faster and more efficient way to get a response.  Please allow 48 business hours for a response.  Please remember that this is for non-urgent requests.  _______________________________________________________

## 2023-06-04 NOTE — Progress Notes (Signed)
06/04/2023 [name] [id number] 11-Dec-1970   HISTORY OF PRESENT ILLNESS: This is a 52 year old female is a patient of Dr. Ardell Isaacs.  Her care will be assumed by Dr. Lavon Paganini as she would like to have her EGD and colonoscopy by the end of the year.  She has history of colon polyps with last colonoscopy in September 2019 with an 8 mm sessile serrated polyp removed.  She also has a history of esophageal stricture/benign stenosis and reflux.  Her last EGD with dilation was in September 2019.  She says that she has noticed some of the dysphagia creeping back up again.  It does not happen all the time, but is starting again for quite some time with meats, breads, etc.  She is not on PPI therapy.  She took some Tagamet for a few days back in the summer for what seemed like some reflux symptoms, but that helped and the symptoms went away so she never continued any therapy.   Past Medical History:  Diagnosis Date   Cardiac murmur    As a child   Chronic headache    Colon polyps    Esophageal stricture    Foot fracture    GERD (gastroesophageal reflux disease)    Hiatal hernia    Uterine polyp    Past Surgical History:  Procedure Laterality Date   COLONOSCOPY     POLYPECTOMY     uterine   uterine polypectomy      reports that she quit smoking about 32 years ago. Her smoking use included cigarettes. She has never used smokeless tobacco. She reports current alcohol use of about 2.0 standard drinks of alcohol per week. She reports that she does not use drugs. family history includes Breast cancer in her paternal aunt; Cancer in her father; Cancer (age of onset: 46) in her paternal aunt; Colon polyps in her maternal aunt and maternal uncle; Diabetes in her father; Heart disease (age of onset: 43) in her father; Hyperlipidemia in her father; Hypertension in her father; Ovarian cancer in her maternal aunt. Allergies  Allergen Reactions   Egg-Derived Products     Causes abd pain and rash /  can eat eggs mixed in food.      Outpatient Encounter Medications as of 06/04/2023  Medication Sig   Ascorbic Acid (VITAMIN C PO) Take by mouth.   Cholecalciferol (VITAMIN D-3) 25 MCG (1000 UT) CAPS Take 2 capsules by mouth.   estradiol (CLIMARA - DOSED IN MG/24 HR) 0.025 mg/24hr patch 0.025 mg once a week.   Multiple Vitamin (MULTIVITAMIN) tablet Take 1 tablet by mouth daily.   Omega-3 Fatty Acids (FISH OIL PO) Take by mouth.   progesterone (PROMETRIUM) 200 MG capsule Take by mouth.   omeprazole (PRILOSEC) 20 MG capsule Take 1 capsule (20 mg total) by mouth 2 (two) times daily before a meal. (Patient not taking: Reported on 06/04/2023)   No facility-administered encounter medications on file as of 06/04/2023.    REVIEW OF SYSTEMS  : All other systems reviewed and negative except where noted in the History of Present Illness.   PHYSICAL EXAM: BP 116/70   Pulse 73   Ht 5\' 7"  (1.702 m)   Wt 146 lb 9.6 oz (66.5 kg)   SpO2 99%   BMI 22.96 kg/m  General: Well developed white female in no acute distress Head: Normocephalic and atraumatic Eyes:  Sclerae anicteric, conjunctiva pink. Ears: Normal auditory acuity Lungs: Clear throughout to auscultation; no W/R/R. Heart: Regular  rate and rhythm; no M/R/G. Rectal:  Will be done at the time of colonoscopy. Musculoskeletal: Symmetrical with no gross deformities  Skin: No lesions on visible extremities Extremities: No edema  Neurological: Alert oriented x 4, grossly non-focal Psychological:  Alert and cooperative. Normal mood and affect  ASSESSMENT AND PLAN: *Personal history of colon polyps: Had an 8 mm sessile serrated polyp removed in September 2019.  Repeat recommended in 5 years. *Dysphagia with history of benign esophageal stenosis/stricture and GERD: Last EGD in September 2019.  Dysphagia creeping back up again with intermittent dysphagia to meat, bread, etc.  Is not on PPI therapy.  **We will plan for both EGD with dilation and  colonoscopy with Dr. Lavon Paganini.  The risks, benefits, and alternatives to EGD with dilation and colonoscopy were discussed with the patient and she consents to proceed.   CC:  Nche, Bonna Gains, NP

## 2023-06-05 ENCOUNTER — Telehealth: Payer: Self-pay | Admitting: Nurse Practitioner

## 2023-06-05 DIAGNOSIS — R1310 Dysphagia, unspecified: Secondary | ICD-10-CM

## 2023-06-05 DIAGNOSIS — Z860100 Personal history of colon polyps, unspecified: Secondary | ICD-10-CM

## 2023-06-05 NOTE — Telephone Encounter (Signed)
Spoke to patient and informed her that referral was placed. Patient verbalized understanding and a thank you.

## 2023-06-05 NOTE — Telephone Encounter (Signed)
Pt called saying she needs a referral from her pcp to [facility]. (Attn: Dr Midge Minium) because her Dr is retiring and Pt lives at [place]. It is easier and closer for her to get their.

## 2023-06-05 NOTE — Telephone Encounter (Signed)
Patient verbalized understanding she will get a referral placed

## 2023-06-05 NOTE — Telephone Encounter (Signed)
Called and left a message for call back sent a mychart to message

## 2023-07-01 ENCOUNTER — Encounter: Payer: BC Managed Care – PPO | Admitting: Gastroenterology

## 2023-07-31 ENCOUNTER — Encounter: Payer: Self-pay | Admitting: Gastroenterology

## 2023-07-31 ENCOUNTER — Ambulatory Visit (INDEPENDENT_AMBULATORY_CARE_PROVIDER_SITE_OTHER): Payer: 59 | Admitting: Gastroenterology

## 2023-07-31 ENCOUNTER — Other Ambulatory Visit: Payer: Self-pay

## 2023-07-31 VITALS — BP 107/73 | HR 77 | Temp 98.0°F | Ht 67.0 in | Wt 149.0 lb

## 2023-07-31 DIAGNOSIS — Z8719 Personal history of other diseases of the digestive system: Secondary | ICD-10-CM | POA: Diagnosis not present

## 2023-07-31 DIAGNOSIS — Z09 Encounter for follow-up examination after completed treatment for conditions other than malignant neoplasm: Secondary | ICD-10-CM

## 2023-07-31 DIAGNOSIS — Z860101 Personal history of adenomatous and serrated colon polyps: Secondary | ICD-10-CM | POA: Diagnosis not present

## 2023-07-31 DIAGNOSIS — Z860100 Personal history of colon polyps, unspecified: Secondary | ICD-10-CM

## 2023-07-31 MED ORDER — NA SULFATE-K SULFATE-MG SULF 17.5-3.13-1.6 GM/177ML PO SOLN: 354.0000 mL | Freq: Once | ORAL | 0 refills | Status: AC

## 2023-07-31 NOTE — Progress Notes (Signed)
 Tonya JONELLE Brooklyn, MD [address]  [address]  [place], [place] [address]  Main: [phone]  Fax: [phone]    Gastroenterology Consultation  Referring Provider:     Katheen Roselie Rockford, NP Primary Care Physician:  Katheen Roselie Rockford, NP Primary Gastroenterologist:  Dr. Gwendlyn Buddy Reason for Consultation: History of esophageal stricture, colon adenoma        HPI:   [name] is a 53 y.o. female referred by Nche, Roselie Rockford, NP  for consultation & management of esophageal stricture and colon adenoma Patient has known history of benign esophageal stricture, nonobstructing, small hiatal hernia which was dilated with savory dilators to 16 mm in 2019.  Patient reports that in summer 2024 when she was traveling, she had mild flareup of symptoms of heartburn, food getting stuck and she took over-the-counter acid suppressive therapy, symptoms subsided in 2 days.  She has been asymptomatic since then and she reports that presence of stricture has not affected the way she is eating or changed her eating habits, not affecting her daily life.  She has not taken any PPI in the recent past She does not have any other GI symptoms.  No evidence of anemia She is also due for surveillance colonoscopy  NSAIDs: None  Antiplts/Anticoagulants/Anti thrombotics: None She does not smoke or drink alcohol Exercises regularly, eats healthy She denies any family history of GI malignancy  GI Procedures:  EGD and colonoscopy 04/07/2018  - Benign- appearing esophageal stenosis. Dilated. - Small hiatal hernia. - Normal duodenal bulb and second portion of the duodenum. - No specimens collected. - One benign- appearing, intrinsic moderate stenosis was found at the gastroesophageal junction. This stenosis measured 1. 3 cm ( inner diameter) . The stenosis was traversed. A guidewire was placed and the scope was withdrawn. Dilations were performed with Savary dilators with mild resistance at 14 mm, 15  mm and 16 mm.  - One 8 mm polyp in the cecum, removed with a cold snare. Resected and retrieved. - The examination was otherwise normal on direct and retroflexion views.  Diagnosis Surgical [P], cecum, polyp - SESSILE SERRATED POLYP WITHOUT CYTOLOGIC DYSPLASIA.  Upper endoscopy 03/22/2008 Findings Normal: Proximal Esophagus to Mid Esophagus.  STRICTURE / STENOSIS: Stricture in Distal Esophagus.  Lumen diameter is 15 mm. ICD9: Esophageal Stricture: 530.3. Comment: subtle stricture.  - Dilation: Distal Esophagus. Savary dilator used, Diameter: 17 mm, Minimal Resistance, No Heme present on extraction. Patient tolerance excellent. Outcome: successful.  Normal: Cardia to Duodenal 2nd Portion.  Past Medical History:  Diagnosis Date   Cardiac murmur    As a child   Chronic headache    Colon polyps    Esophageal stricture    Foot fracture    GERD (gastroesophageal reflux disease)    Hiatal hernia    Uterine polyp     Past Surgical History:  Procedure Laterality Date   COLONOSCOPY     POLYPECTOMY     uterine   uterine polypectomy       Current Outpatient Medications:    Ascorbic Acid (VITAMIN C PO), Take by mouth., Disp: , Rfl:    Cholecalciferol (VITAMIN D -3) 25 MCG (1000 UT) CAPS, Take 2 capsules by mouth., Disp: , Rfl:    estradiol (CLIMARA - DOSED IN MG/24 HR) 0.025 mg/24hr patch, 0.025 mg once a week., Disp: , Rfl:    Multiple Vitamin (MULTIVITAMIN) tablet, Take 1 tablet by mouth daily., Disp: , Rfl:    Na Sulfate-K Sulfate-Mg Sulf  17.5-3.13-1.6 GM/177ML SOLN, Take 354 mLs by mouth once for 1 dose., Disp: 354 mL, Rfl: 0   Omega-3 Fatty Acids (FISH OIL PO), Take by mouth., Disp: , Rfl:    omeprazole  (PRILOSEC) 20 MG capsule, Take 1 capsule (20 mg total) by mouth 2 (two) times daily before a meal., Disp: 14 capsule, Rfl: 0   progesterone (PROMETRIUM) 200 MG capsule, Take by mouth., Disp: , Rfl:    Family History  Problem Relation Age of Onset   Diabetes Father     Hyperlipidemia Father    Hypertension Father    Cancer Father        prostate CA   Heart disease Father 71       cause of death   Ovarian cancer Maternal Aunt    Colon polyps Maternal Aunt    Colon polyps Maternal Uncle    Cancer Paternal Aunt 24       breast   Breast cancer Paternal Aunt    Colon cancer Neg Hx    Esophageal cancer Neg Hx    Rectal cancer Neg Hx    Stomach cancer Neg Hx      Social History   Tobacco Use   Smoking status: Former    Current packs/day: 0.00    Types: Cigarettes    Quit date: 04/08/1991    Years since quitting: 32.3   Smokeless tobacco: Never   Tobacco comments:    OCCASIONAL SMOKER IN COLLEGE.  Vaping Use   Vaping status: Never Used  Substance Use Topics   Alcohol use: Yes    Alcohol/week: 2.0 standard drinks of alcohol    Types: 2 Glasses of wine per week   Drug use: No    Allergies as of 07/31/2023 - Review Complete 07/31/2023  Allergen Reaction Noted   Egg-derived products  03/16/2013    Review of Systems:    All systems reviewed and negative except where noted in HPI.   Physical Exam:  BP 107/73 (BP Location: Right Arm, Patient Position: Sitting, Cuff Size: Normal)   Pulse 77   Temp 98 F (36.7 C) (Oral)   Ht 5' 7 (1.702 m)   Wt 149 lb (67.6 kg)   BMI 23.34 kg/m  No LMP recorded.  General:   Alert,  Well-developed, well-nourished, pleasant and cooperative in NAD Head:  Normocephalic and atraumatic. Eyes:  Sclera clear, no icterus.   Conjunctiva pink. Ears:  Normal auditory acuity. Nose:  No deformity, discharge, or lesions. Mouth:  No deformity or lesions,oropharynx pink & moist. Neck:  Supple; no masses or thyromegaly. Lungs:  Respirations even and unlabored.  Clear throughout to auscultation.   No wheezes, crackles, or rhonchi. No acute distress. Heart:  Regular rate and rhythm; no murmurs, clicks, rubs, or gallops. Abdomen:  Normal bowel sounds. Soft, non-tender and non-distended without masses, hepatosplenomegaly  or hernias noted.  No guarding or rebound tenderness.   Rectal: Not performed Msk:  Symmetrical without gross deformities. Good, equal movement & strength bilaterally. Pulses:  Normal pulses noted. Extremities:  No clubbing or edema.  No cyanosis. Neurologic:  Alert and oriented x3;  grossly normal neurologically. Skin:  Intact without significant lesions or rashes. No jaundice. Psych:  Alert and cooperative. Normal mood and affect.  Imaging Studies: None  Assessment and Plan:   [name] is a 53 y.o. female with no significant past medical history, known history of nonobstructive benign esophageal stricture status post dilation in the past.  Benign esophageal stricture, currently asymptomatic No  symptoms of GERD Do not recommend repeat EGD at this time  History of sessile serrated adenoma of the colon Schedule surveillance colonoscopy   Follow up as needed   Tonya JONELLE Brooklyn, MD

## 2023-08-01 ENCOUNTER — Other Ambulatory Visit: Payer: Self-pay | Admitting: Obstetrics and Gynecology

## 2023-08-01 DIAGNOSIS — Z1231 Encounter for screening mammogram for malignant neoplasm of breast: Secondary | ICD-10-CM

## 2023-08-04 ENCOUNTER — Encounter: Payer: Self-pay | Admitting: Gastroenterology

## 2023-08-18 ENCOUNTER — Encounter: Payer: BC Managed Care – PPO | Admitting: Nurse Practitioner

## 2023-08-19 ENCOUNTER — Encounter: Payer: BC Managed Care – PPO | Admitting: Nurse Practitioner

## 2023-08-19 ENCOUNTER — Encounter: Payer: Self-pay | Admitting: Nurse Practitioner

## 2023-08-19 ENCOUNTER — Telehealth: Payer: Self-pay | Admitting: Nurse Practitioner

## 2023-08-19 ENCOUNTER — Ambulatory Visit: Admit: 2023-08-19 | Payer: 59 | Admitting: Gastroenterology

## 2023-08-19 SURGERY — COLONOSCOPY WITH PROPOFOL
Anesthesia: General

## 2023-08-20 NOTE — Telephone Encounter (Signed)
08/19/2023 late arrival 10:17a, pt rescheduled for March 2025, letter sent via [facility]

## 2023-09-26 ENCOUNTER — Ambulatory Visit (INDEPENDENT_AMBULATORY_CARE_PROVIDER_SITE_OTHER): Payer: 59 | Admitting: Nurse Practitioner

## 2023-09-26 ENCOUNTER — Encounter: Payer: Self-pay | Admitting: Nurse Practitioner

## 2023-09-26 VITALS — BP 112/74 | HR 73 | Temp 97.5°F | Ht 67.0 in | Wt 143.0 lb

## 2023-09-26 DIAGNOSIS — Z136 Encounter for screening for cardiovascular disorders: Secondary | ICD-10-CM

## 2023-09-26 DIAGNOSIS — Z13220 Encounter for screening for lipoid disorders: Secondary | ICD-10-CM

## 2023-09-26 DIAGNOSIS — Z0000 Encounter for general adult medical examination without abnormal findings: Secondary | ICD-10-CM | POA: Diagnosis not present

## 2023-09-26 DIAGNOSIS — E041 Nontoxic single thyroid nodule: Secondary | ICD-10-CM | POA: Diagnosis not present

## 2023-09-26 NOTE — Patient Instructions (Signed)
 Schedule fasting lab appt. Need to be fasting 8hrs prior to blood draw.  Maintain Heart healthy diet and daily exercise. Maintain current medications.

## 2023-09-26 NOTE — Progress Notes (Signed)
 Complete physical exam  Patient: [name]   DOB: [birth year]   53 y.o. Female  MRN: [id number] Visit Date: 09/26/2023  Subjective:    Chief Complaint  Patient presents with   Annual Exam    Nonfasting. Has mammogram scheduled with OBGYN. Would like referral to GI for colonoscopy. Prev gi was [facility].    [name] is a 53 y.o. female who presents today for a complete physical exam. She reports consuming a low fat and low sodium diet.  Walking and yoga daily  She generally feels well. She reports sleeping well. She does not have additional problems to discuss today.  Vision:Yes Dental:Yes STD Screen:No Mammogram scheduled with GYN 09/26/2023  BP Readings from Last 3 Encounters:  09/26/23 112/74  07/31/23 107/73  06/04/23 116/70   Wt Readings from Last 3 Encounters:  09/26/23 143 lb (64.9 kg)  07/31/23 149 lb (67.6 kg)  06/04/23 146 lb 9.6 oz (66.5 kg)   Most recent fall risk assessment:    09/26/2023    1:08 PM  Fall Risk   Falls in the past year? 0  Number falls in past yr: 0  Injury with Fall? 0  Risk for fall due to : No Fall Risks  Follow up Falls evaluation completed   Depression screen:Yes - No Depression Most recent depression screenings:    09/26/2023    1:09 PM 08/13/2022    1:15 PM  PHQ 2/9 Scores  PHQ - 2 Score 0 0  PHQ- 9 Score 1    HPI  No problem-specific Assessment & Plan notes found for this encounter.   Past Medical History:  Diagnosis Date   Cardiac murmur    As a child   Chronic headache    Colon polyps    Esophageal stricture    Foot fracture    GERD (gastroesophageal reflux disease)    Hiatal hernia    Uterine polyp    Past Surgical History:  Procedure Laterality Date   COLONOSCOPY     POLYPECTOMY     uterine   uterine polypectomy     Social History   Socioeconomic History   Marital status: Married    Spouse name: Not on file   Number of children: 2   Years of education: Not on file   Highest education level: Not on file   Occupational History   Not on file  Tobacco Use   Smoking status: Former    Current packs/day: 0.00    Types: Cigarettes    Quit date: 04/08/1991    Years since quitting: 32.4   Smokeless tobacco: Never   Tobacco comments:    OCCASIONAL SMOKER IN COLLEGE.  Vaping Use   Vaping status: Never Used  Substance and Sexual Activity   Alcohol use: Yes    Alcohol/week: 2.0 standard drinks of alcohol    Types: 2 Glasses of wine per week   Drug use: No   Sexual activity: Yes    Birth control/protection: Condom, Rhythm  Other Topics Concern   Not on file  Social History Narrative   Not on file   Social Drivers of Health   Financial Resource Strain: Not on file  Food Insecurity: Not on file  Transportation Needs: Not on file  Physical Activity: Not on file  Stress: Not on file  Social Connections: Not on file  Intimate Partner Violence: Not on file   Family Status  Relation Name Status   Mother  Alive   Father  Deceased   Mat Aunt  (Not Specified)   Mat Uncle  (Not Specified)   Dennie Bible Aunt  (Not Specified)   Neg Hx  (Not Specified)  No partnership data on file   Family History  Problem Relation Age of Onset   Diabetes Father    Hyperlipidemia Father    Hypertension Father    Cancer Father        prostate CA   Heart disease Father 38       cause of death   Ovarian cancer Maternal Aunt    Colon polyps Maternal Aunt    Colon polyps Maternal Uncle    Cancer Paternal Aunt 51       breast   Breast cancer Paternal Aunt    Colon cancer Neg Hx    Esophageal cancer Neg Hx    Rectal cancer Neg Hx    Stomach cancer Neg Hx    Allergies  Allergen Reactions   Egg-Derived Products     Causes abd pain and rash / can eat eggs mixed in food.    Patient Care Team: Vickki Igou, Bonna Gains, NP as PCP - General (Internal Medicine)   Medications: Outpatient Medications Prior to Visit  Medication Sig   Ascorbic Acid (VITAMIN C PO) Take by mouth.   Cholecalciferol (VITAMIN D-3) 25 MCG  (1000 UT) CAPS Take 2 capsules by mouth.   estradiol (CLIMARA - DOSED IN MG/24 HR) 0.025 mg/24hr patch 0.025 mg once a week.   Multiple Vitamin (MULTIVITAMIN) tablet Take 1 tablet by mouth daily.   Omega-3 Fatty Acids (FISH OIL PO) Take by mouth.   progesterone (PROMETRIUM) 200 MG capsule Take by mouth.   No facility-administered medications prior to visit.    Review of Systems  Constitutional:  Negative for activity change, appetite change and unexpected weight change.  Respiratory: Negative.    Cardiovascular: Negative.   Gastrointestinal: Negative.   Endocrine: Negative for cold intolerance and heat intolerance.  Genitourinary: Negative.   Musculoskeletal: Negative.   Skin: Negative.   Neurological: Negative.   Hematological: Negative.   Psychiatric/Behavioral:  Negative for behavioral problems, decreased concentration, dysphoric mood, hallucinations, self-injury, sleep disturbance and suicidal ideas. The patient is not nervous/anxious.         Objective:  BP 112/74   Pulse 73   Temp (!) 97.5 F (36.4 C) (Temporal)   Ht 5\' 7"  (1.702 m)   Wt 143 lb (64.9 kg)   LMP 08/30/2023   SpO2 99%   BMI 22.40 kg/m     Physical Exam Vitals and nursing note reviewed.  Constitutional:      General: She is not in acute distress. HENT:     Right Ear: Tympanic membrane, ear canal and external ear normal.     Left Ear: Tympanic membrane, ear canal and external ear normal.     Nose: Nose normal.  Eyes:     Extraocular Movements: Extraocular movements intact.     Conjunctiva/sclera: Conjunctivae normal.     Pupils: Pupils are equal, round, and reactive to light.  Neck:     Thyroid: No thyroid mass, thyromegaly or thyroid tenderness.  Cardiovascular:     Rate and Rhythm: Normal rate and regular rhythm.     Pulses: Normal pulses.     Heart sounds: Normal heart sounds.  Pulmonary:     Effort: Pulmonary effort is normal.     Breath sounds: Normal breath sounds.  Abdominal:      General: Bowel sounds are normal.  Palpations: Abdomen is soft.  Musculoskeletal:        General: Normal range of motion.     Cervical back: Normal range of motion and neck supple.     Right lower leg: No edema.     Left lower leg: No edema.  Lymphadenopathy:     Cervical: No cervical adenopathy.  Skin:    General: Skin is warm and dry.  Neurological:     Mental Status: She is alert and oriented to person, place, and time.     Cranial Nerves: No cranial nerve deficit.  Psychiatric:        Mood and Affect: Mood normal.        Behavior: Behavior normal.        Thought Content: Thought content normal.     No results found for any visits on 09/26/23.    Assessment & Plan:    Routine Health Maintenance and Physical Exam  Immunization History  Administered Date(s) Administered   Influenza,inj,Quad PF,6+ Mos 04/15/2016   PFIZER(Purple Top)SARS-COV-2 Vaccination 09/19/2019, 10/12/2019   PPD Test 04/15/2016   Tdap 08/31/2018   Unspecified SARS-COV-2 Vaccination 08/23/2019, 09/20/2019    Health Maintenance  Topic Date Due   Colonoscopy  04/08/2023   MAMMOGRAM  08/28/2023   COVID-19 Vaccine (5 - 2024-25 season) 10/12/2023 (Originally 03/23/2023)   Zoster Vaccines- Shingrix (1 of 2) 12/27/2023 (Originally 01/08/1989)   Hepatitis C Screening  09/25/2024 (Originally 01/09/1988)   Cervical Cancer Screening (HPV/Pap Cotest)  09/01/2024   DTaP/Tdap/Td (2 - Td or Tdap) 08/31/2028   HPV VACCINES  Aged Out   INFLUENZA VACCINE  Discontinued   HIV Screening  Discontinued   Discussed health benefits of physical activity, and encouraged her to engage in regular exercise appropriate for her age and condition. Advised to schedule appointment for repeat colonoscopy.  Problem List Items Addressed This Visit     Thyroid nodule   Relevant Orders   TSH   Other Visit Diagnoses       Preventative health care    -  Primary   Relevant Orders   Lipid panel   Comprehensive metabolic panel      Encounter for lipid screening for cardiovascular disease       Relevant Orders   Lipid panel      Return in about 1 year (around 09/25/2024) for CPE (fasting).     Alysia Penna, NP

## 2023-09-30 ENCOUNTER — Other Ambulatory Visit

## 2023-10-01 ENCOUNTER — Other Ambulatory Visit (INDEPENDENT_AMBULATORY_CARE_PROVIDER_SITE_OTHER)

## 2023-10-01 DIAGNOSIS — Z136 Encounter for screening for cardiovascular disorders: Secondary | ICD-10-CM

## 2023-10-01 DIAGNOSIS — Z13220 Encounter for screening for lipoid disorders: Secondary | ICD-10-CM | POA: Diagnosis not present

## 2023-10-01 DIAGNOSIS — E041 Nontoxic single thyroid nodule: Secondary | ICD-10-CM

## 2023-10-01 DIAGNOSIS — Z0000 Encounter for general adult medical examination without abnormal findings: Secondary | ICD-10-CM | POA: Diagnosis not present

## 2023-10-01 LAB — COMPREHENSIVE METABOLIC PANEL
ALT: 15 U/L (ref 0–35)
AST: 15 U/L (ref 0–37)
Albumin: 3.9 g/dL (ref 3.5–5.2)
Alkaline Phosphatase: 78 U/L (ref 39–117)
BUN: 14 mg/dL (ref 6–23)
CO2: 29 meq/L (ref 19–32)
Calcium: 9.2 mg/dL (ref 8.4–10.5)
Chloride: 105 meq/L (ref 96–112)
Creatinine, Ser: 0.72 mg/dL (ref 0.40–1.20)
GFR: 95.25 mL/min (ref >60.00)
Glucose, Bld: 92 mg/dL (ref 70–99)
Potassium: 4.4 meq/L (ref 3.5–5.1)
Sodium: 138 meq/L (ref 135–145)
Total Bilirubin: 0.4 mg/dL (ref 0.2–1.2)
Total Protein: 5.9 g/dL — ABNORMAL LOW (ref 6.0–8.3)

## 2023-10-01 LAB — LIPID PANEL
Cholesterol: 170 mg/dL (ref 0–200)
HDL: 67.4 mg/dL (ref >39.00)
LDL Cholesterol: 88 mg/dL (ref 0–99)
NonHDL: 102.63
Total CHOL/HDL Ratio: 3
Triglycerides: 71 mg/dL (ref 0.0–149.0)
VLDL: 14.2 mg/dL (ref 0.0–40.0)

## 2023-10-01 LAB — TSH: TSH: 1.26 u[IU]/mL (ref 0.35–5.50)

## 2023-10-02 ENCOUNTER — Encounter: Payer: Self-pay | Admitting: Nurse Practitioner

## 2023-12-26 ENCOUNTER — Telehealth: Payer: Self-pay | Admitting: Nurse Practitioner

## 2023-12-26 NOTE — Telephone Encounter (Unsigned)
 Copied from CRM [id number]. Topic: Clinical - Medication Question >> Dec 26, 2023 12:00 PM [name] wrote: Reason for CRM: Pt request to have a cardiac CT test- pt would like to be contacted first [phone]

## 2024-04-12 ENCOUNTER — Encounter (INDEPENDENT_AMBULATORY_CARE_PROVIDER_SITE_OTHER): Payer: Self-pay | Admitting: Nurse Practitioner

## 2024-04-12 DIAGNOSIS — E7800 Pure hypercholesterolemia, unspecified: Secondary | ICD-10-CM | POA: Diagnosis not present

## 2024-04-20 NOTE — Telephone Encounter (Signed)
Please see the MyChart message reply(ies) for my assessment and plan.  The patient gave consent for this Medical Advice Message and is aware that it may result in a bill to their insurance company as well as the possibility that this may result in a co-payment or deductible. They are an established patient, but are not seeking medical advice exclusively about a problem treated during an in person or video visit in the last 7 days. I did not recommend an in person or video visit within 7 days of my reply.  I spent a total of 15 minutes cumulative time within 7 days through [place], NP

## 2024-04-20 NOTE — Addendum Note (Signed)
 Addended by: KATHEEN ROSELIE CROME on: 04/20/2024 02:44 PM   Modules accepted: Orders

## 2024-04-26 ENCOUNTER — Ambulatory Visit
Admission: RE | Admit: 2024-04-26 | Discharge: 2024-04-26 | Disposition: A | Discharge status: Home / Self Care | Payer: Self-pay | Source: Ambulatory Visit | Attending: Nurse Practitioner | Admitting: Nurse Practitioner

## 2024-04-26 DIAGNOSIS — E7800 Pure hypercholesterolemia, unspecified: Secondary | ICD-10-CM | POA: Insufficient documentation

## 2024-04-27 ENCOUNTER — Ambulatory Visit: Payer: Self-pay | Admitting: Nurse Practitioner

## 2024-04-27 DIAGNOSIS — I2510 Atherosclerotic heart disease of native coronary artery without angina pectoris: Secondary | ICD-10-CM

## 2024-04-27 DIAGNOSIS — Z8249 Family history of ischemic heart disease and other diseases of the circulatory system: Secondary | ICD-10-CM

## 2024-04-27 DIAGNOSIS — E7800 Pure hypercholesterolemia, unspecified: Secondary | ICD-10-CM

## 2024-05-05 ENCOUNTER — Encounter: Payer: Self-pay | Admitting: Nurse Practitioner

## 2024-06-15 ENCOUNTER — Ambulatory Visit: Attending: Internal Medicine | Admitting: Internal Medicine

## 2024-06-15 ENCOUNTER — Encounter: Payer: Self-pay | Admitting: Internal Medicine

## 2024-06-15 VITALS — BP 110/64 | HR 76 | Ht 66.0 in | Wt 140.7 lb

## 2024-06-15 DIAGNOSIS — R931 Abnormal findings on diagnostic imaging of heart and coronary circulation: Secondary | ICD-10-CM | POA: Insufficient documentation

## 2024-06-15 DIAGNOSIS — Z8249 Family history of ischemic heart disease and other diseases of the circulatory system: Secondary | ICD-10-CM | POA: Diagnosis not present

## 2024-06-15 DIAGNOSIS — E782 Mixed hyperlipidemia: Secondary | ICD-10-CM | POA: Insufficient documentation

## 2024-06-15 DIAGNOSIS — R7689 Other specified abnormal immunological findings in serum: Secondary | ICD-10-CM | POA: Insufficient documentation

## 2024-06-15 MED ORDER — ASPIRIN 81 MG PO TBEC: 81.0000 mg | DELAYED_RELEASE_TABLET | Freq: Every day | ORAL | 12 refills | Status: AC

## 2024-06-15 MED ORDER — ROSUVASTATIN CALCIUM 10 MG PO TABS: 10.0000 mg | ORAL_TABLET | Freq: Every day | ORAL | 3 refills | Status: AC

## 2024-06-15 MED ORDER — METOPROLOL TARTRATE 50 MG PO TABS: 50.0000 mg | ORAL_TABLET | Freq: Once | ORAL | 0 refills | Status: DC

## 2024-06-15 NOTE — Progress Notes (Signed)
 Cardiology Office Note   Date:  06/15/2024  ID:  [name], DOB [birth year], MRN [id number] PCP: Katheen Roselie Rockford, NP  [facility] Providers Cardiologist:  Emeline FORBES Calender, DO     History of Present Illness [name] is a 53 y.o. female with a history of esophageal stricture with dilation in 2019, ANA positive, thyroid  peroxidase antibody positive, GERD who was referred by her PCP due to an elevated calcium  score of 215.  Patient's brother-in-law passed away recently suddenly from CAD and since that time her and her husband have been undergoing health maintenance screening including lab work and coronary calcium  score.  Lab work showed that she had an elevated LDL of various particle size (labs in media tab) and a calcium  score was elevated at 215.  Today, she states she has a family history of premature CAD in her uncle who had an MI in his early 17s.  She has not had any chest pain, shortness of breath or any other complaints but is very concerned about the abnormal lab value  Tobacco use: No Alcohol use: No Diet mainly consists of: Reports nonspecific clean healthy diet    ROS:  Review of Systems  All other systems reviewed and are negative.   Physical Exam  Physical Exam Vitals and nursing note reviewed.  Constitutional:      Appearance: Normal appearance.  HENT:     Head: Normocephalic and atraumatic.  Eyes:     Conjunctiva/sclera: Conjunctivae normal.  Neck:     Vascular: No carotid bruit.  Cardiovascular:     Rate and Rhythm: Normal rate and regular rhythm.  Pulmonary:     Effort: Pulmonary effort is normal.     Breath sounds: Normal breath sounds.  Musculoskeletal:        General: No swelling or tenderness.  Skin:    Coloration: Skin is not jaundiced or pale.  Neurological:     Mental Status: She is alert.     VS:  BP 110/64   Pulse 76   Ht 5' 6 (1.676 m)   Wt 140 lb 11.2 oz (63.8 kg)   SpO2 98%   BMI 22.71 kg/m         Wt  Readings from Last 3 Encounters:  06/15/24 140 lb 11.2 oz (63.8 kg)  09/26/23 143 lb (64.9 kg)  07/31/23 149 lb (67.6 kg)     EKG Interpretation Date/Time:  Tuesday June 15 2024 10:34:35 EST Ventricular Rate:  76 PR Interval:  130 QRS Duration:  64 QT Interval:  370 QTC Calculation: 416 R Axis:   76  Text Interpretation: Normal sinus rhythm Right atrial enlargement Septal infarct , age undetermined When compared with ECG of 10-Nov-2013 11:10, Right atrial enlargement NOW PRESENT PR interval has increased Confirmed by Calender Emeline [phone]) on 06/15/2024 10:37:10 AM    Studies Reviewed   Coronary calcium  score 04/26/2024: 215 (98th percentile) Coronary arteries: Normal origin of left and right coronary arteries.   LM 150 LAD 17  LCx 48 RCA 0    Risk Assessment/Calculations             ASCVD risk score: The 10-year ASCVD risk score (Arnett DK, et al., 2019) is: 0.9%   Values used to calculate the score:     Age: 13 years     Clincally relevant sex: Female     Is Non-Hispanic African American: No     Diabetic: No     Tobacco smoker: No  Systolic Blood Pressure: 110 mmHg     Is BP treated: No     HDL Cholesterol: 67.4 mg/dL     Total Cholesterol: 170 mg/dL   ASSESSMENT  Elevated coronary calcium  score calcium  score of 215 with majority in left main Esophageal stricture post dilatation in 2019 GERD Family history of premature CAD in uncle Hyperlipidemia labs 10/10/2023 in Media tab - total cholesterol 196, HDL 80, triglycerides 64, LDL 102 (LDL particle 1684 corresponds to a high relative risk, small 243 corresponds to a high relative risk, medium 302 corresponds to a high relative risk).  LP(a) 38 ANA positive RF negative Thyroid  peroxidase antibodies with normal TSH   Plan  Start aspirin  81 mg Start rosuvastatin  10 mg with lipid panel in 3 months Coronary CTA with BMP and beta-blocker prior to evaluate for obstructive CAD Echocardiogram Will defer to  PCP for Hashimoto's disease workup and positive ANA Cardiac risk counseling and prevention recommendations: Heart healthy/Mediterranean diet with whole grains, fruits, vegetable, fish, lean meats, nuts, and olive oil. Limit salt. Moderate walking, 3-5 times/week for 30-50 minutes each session. Aim for at least 150 minutes.week. Goal should be pace of 3 miles/hour, or walking 1.5 miles in 30 minutes Avoidance of tobacco products. Avoid excess alcohol.  Follow up: 6 to 8 weeks posttesting          Signed, Emeline FORBES Calender, DO

## 2024-06-15 NOTE — Patient Instructions (Addendum)
 Medication Instructions:   Start Aspirin  81 mg  daily  Rosuvastatin  10 mg  daily    One time dose of Metoprolol   50 mg   *If you need a refill on your cardiac medications before your next appointment, please call your pharmacy*   Lab Work: fasting BMP LP ( a) Lipid- 3 months   If you have labs (blood work) drawn today and your tests are completely normal, you will receive your results only by: MyChart Message (if you have MyChart) OR A paper copy in the mail If you have any lab test that is abnormal or we need to change your treatment, we will call you to review the results.   Testing/Procedures:  1) Your physician has requested that you have an echocardiogram. Echocardiography is a painless test that uses sound waves to create images of your heart. It provides your doctor with information about the size and shape of your heart and how well your heart's chambers and valves are working. This procedure takes approximately one hour. There are no restrictions for this procedure. Please do NOT wear cologne, perfume, aftershave, or lotions (deodorant is allowed). Please arrive 15 minutes prior to your appointment time.  Please note: We ask at that you not bring children with you during ultrasound (echo/ vascular) testing. Due to room size and safety concerns, children are not allowed in the ultrasound rooms during exams. Our front office staff cannot provide observation of children in our lobby area while testing is being conducted. An adult accompanying a patient to their appointment will only be allowed in the ultrasound room at the discretion of the ultrasound technician under special circumstances. We apologize for any inconvenience.  2) Your physician has requested that you have coronary  CTA. Coronary computed tomography (CT)angiogram  is a special type of CT scan that uses a computer to produce multi-dimensional views of major blood vessels throughout the heart.  CT angiography, a  contrast material is injected through an IV to help visualize the blood vessels  a painless test that uses an x-ray machine to take clear, detailed pictures of your heart arteries .  Please follow instruction sheet as given.  Follow-Up: At [facility], you and your health needs are our priority.  As part of our continuing mission to provide you with exceptional heart care, we have created designated Provider Care Teams.  These Care Teams include your primary Cardiologist (physician) and Advanced Practice Providers (APPs -  Physician Assistants and Nurse Practitioners) who all work together to provide you with the care you need, when you need it.     Your next appointment:   1  to 2 month(s)   all testing completed  The format for your next appointment:   In Person  Provider:   Emeline FORBES Calender, DO   Other Instructions    Your cardiac CT will be scheduled at the below locations:     [facility] [address]  [place], [place] [address]   If scheduled at the [facility] at [address], please enter the parking lot using the [address] entrance and use the FREE valet service at the patient drop-off area. Enter the building and check-in with registration on the main floor.   Please follow these instructions carefully (unless otherwise directed):  An IV will be required for this test and Nitroglycerin  will be given.    On the Night Before the Test: Be sure to Drink plenty of  water. Do not consume any caffeinated/decaffeinated beverages or chocolate 12 hours prior to your test. Do not take any antihistamines 12 hours prior to your test.    On the Day of the Test: Drink plenty of water until 1 hour prior to the test. Do not eat any food 1 hour prior to test. You may take your regular medications prior to the test.  Take metoprolol  (Lopressor )  50 mg two hours prior to test. FEMALES- please wear underwire-free bra if  available, avoid dresses & tight clothing        After the Test: Drink plenty of water. After receiving IV contrast, you may experience a mild flushed feeling. This is normal. On occasion, you may experience a mild rash up to 24 hours after the test. This is not dangerous. If this occurs, you can take Benadryl 25 mg, Zyrtec, Claritin, or Allegra and increase your fluid intake. (Patients taking Tikosyn should avoid Benadryl, and may take Zyrtec, Claritin, or Allegra) If you experience trouble breathing, this can be serious. If it is severe call 911 IMMEDIATELY. If it is mild, please call our office.  We will call to schedule your test 2-4 weeks out understanding that some insurance companies will need an authorization prior to the service being performed.   For more information and frequently asked questions, please visit our website : [email or web address]  For non-scheduling related questions, please contact the cardiac imaging nurse navigator should you have any questions/concerns: Cardiac Imaging Nurse Navigators Direct Office Dial: [phone]   For scheduling needs, including cancellations and rescheduling, please call Brittany, [phone].

## 2024-06-16 ENCOUNTER — Ambulatory Visit: Payer: Self-pay | Admitting: Internal Medicine

## 2024-06-16 DIAGNOSIS — E782 Mixed hyperlipidemia: Secondary | ICD-10-CM

## 2024-06-16 LAB — BASIC METABOLIC PANEL WITH GFR
BUN/Creatinine Ratio: 19 (ref 9–23)
BUN: 15 mg/dL (ref 6–24)
CO2: 23 mmol/L (ref 20–29)
Calcium: 9.6 mg/dL (ref 8.7–10.2)
Chloride: 104 mmol/L (ref 96–106)
Creatinine, Ser: 0.77 mg/dL (ref 0.57–1.00)
Glucose: 86 mg/dL (ref 70–99)
Potassium: 4.5 mmol/L (ref 3.5–5.2)
Sodium: 142 mmol/L (ref 134–144)
eGFR: 92 mL/min/1.73 (ref >59)

## 2024-06-16 LAB — LIPOPROTEIN A (LPA): Lipoprotein (a): 29.2 nmol/L (ref <75.0)

## 2024-06-24 ENCOUNTER — Ambulatory Visit: Payer: Self-pay | Admitting: Internal Medicine

## 2024-07-27 ENCOUNTER — Encounter (HOSPITAL_COMMUNITY): Payer: Self-pay

## 2024-07-28 ENCOUNTER — Ambulatory Visit (HOSPITAL_COMMUNITY)
Admission: RE | Admit: 2024-07-28 | Discharge: 2024-07-28 | Disposition: A | Discharge status: Home / Self Care | Source: Ambulatory Visit | Attending: Internal Medicine | Admitting: Internal Medicine

## 2024-07-28 ENCOUNTER — Ambulatory Visit: Payer: Self-pay | Admitting: Internal Medicine

## 2024-07-28 ENCOUNTER — Telehealth (HOSPITAL_COMMUNITY): Payer: Self-pay | Admitting: *Deleted

## 2024-07-28 DIAGNOSIS — Z8249 Family history of ischemic heart disease and other diseases of the circulatory system: Secondary | ICD-10-CM | POA: Insufficient documentation

## 2024-07-28 DIAGNOSIS — R931 Abnormal findings on diagnostic imaging of heart and coronary circulation: Secondary | ICD-10-CM | POA: Diagnosis present

## 2024-07-28 DIAGNOSIS — R011 Cardiac murmur, unspecified: Secondary | ICD-10-CM | POA: Insufficient documentation

## 2024-07-28 DIAGNOSIS — I2510 Atherosclerotic heart disease of native coronary artery without angina pectoris: Secondary | ICD-10-CM | POA: Insufficient documentation

## 2024-07-28 LAB — ECHOCARDIOGRAM COMPLETE
Area-P 1/2: 6.17 cm2
S' Lateral: 3 cm

## 2024-07-28 NOTE — Telephone Encounter (Signed)

## 2024-07-29 ENCOUNTER — Ambulatory Visit
Admission: RE | Admit: 2024-07-29 | Discharge: 2024-07-29 | Disposition: A | Discharge status: Home / Self Care | Source: Ambulatory Visit | Attending: Internal Medicine | Admitting: Internal Medicine

## 2024-07-29 DIAGNOSIS — R931 Abnormal findings on diagnostic imaging of heart and coronary circulation: Secondary | ICD-10-CM | POA: Diagnosis present

## 2024-07-29 DIAGNOSIS — R9430 Abnormal result of cardiovascular function study, unspecified: Secondary | ICD-10-CM

## 2024-07-29 MED ORDER — IOHEXOL 350 MG/ML SOLN
100.0000 mL | Freq: Once | INTRAVENOUS | Status: AC | PRN
  Administered 2024-07-29: 100 mL via INTRAVENOUS

## 2024-07-29 MED ORDER — METOPROLOL TARTRATE 5 MG/5ML IV SOLN: 10.0000 mg | Freq: Once | INTRAVENOUS | Status: DC | PRN

## 2024-07-29 MED ORDER — SODIUM CHLORIDE 0.9 % IV BOLUS
500.0000 mL | Freq: Once | INTRAVENOUS | Status: AC
  Administered 2024-07-29: 500 mL via INTRAVENOUS

## 2024-07-29 MED ORDER — DILTIAZEM HCL 25 MG/5ML IV SOLN: 10.0000 mg | INTRAVENOUS | Status: DC | PRN

## 2024-07-29 MED ORDER — NITROGLYCERIN 0.4 MG SL SUBL
0.8000 mg | SUBLINGUAL_TABLET | Freq: Once | SUBLINGUAL | Status: AC
  Administered 2024-07-29: 0.8 mg via SUBLINGUAL

## 2024-07-29 NOTE — Progress Notes (Signed)
 Patient tolerated procedure well. W/C to lobby.  Ambulate w/o difficulty. Denies light headedness or being dizzy. Encouraged to drink extra water today and reasoning explained. Verbalized understanding. All questions answered. ABC intact. No further needs. Discharge from procedure area w/o issues.

## 2024-08-24 LAB — LIPID PANEL
Chol/HDL Ratio: 1.9 ratio (ref 0.0–4.4)
Cholesterol, Total: 152 mg/dL (ref 100–199)
HDL: 81 mg/dL
LDL Chol Calc (NIH): 59 mg/dL (ref 0–99)
Triglycerides: 60 mg/dL (ref 0–149)
VLDL Cholesterol Cal: 12 mg/dL (ref 5–40)

## 2024-08-26 ENCOUNTER — Ambulatory Visit: Admitting: Internal Medicine

## 2024-08-26 VITALS — BP 106/62 | HR 81 | Ht 67.0 in | Wt 142.0 lb

## 2024-08-26 DIAGNOSIS — Z8249 Family history of ischemic heart disease and other diseases of the circulatory system: Secondary | ICD-10-CM

## 2024-08-26 DIAGNOSIS — I2510 Atherosclerotic heart disease of native coronary artery without angina pectoris: Secondary | ICD-10-CM

## 2024-08-26 DIAGNOSIS — E782 Mixed hyperlipidemia: Secondary | ICD-10-CM

## 2024-08-26 DIAGNOSIS — R931 Abnormal findings on diagnostic imaging of heart and coronary circulation: Secondary | ICD-10-CM | POA: Diagnosis not present

## 2024-08-26 NOTE — Progress Notes (Signed)
 " Cardiology Office Note:  .   Date:  08/26/2024  ID:  [name], DOB 1970-09-03, MRN [id number] PCP: Katheen Roselie Rockford, NP  [facility] Providers Cardiologist:  Tonya FORBES Calender, Patterson    History of Present Illness: .     Discussed the use of AI scribe software for clinical note transcription with the patient, who gave verbal consent to proceed.  History of Present Illness [name] is a 54 year old female with coronary artery disease who presents for follow-up of her condition.  Coronary artery disease - Coronary artery disease initially identified by elevated coronary calcium  score. - Coronary calcium  score of 223 (98th percentile for age, sex, and race) on August 08, 2024. - Coronary CTA on August 08, 2024, showed mild stenosis of the proximal LAD in the D1 branch, minimal stenosis of the distal left main, mid LAD, and mid circumflex. - Currently asymptomatic with no chest pain. - No chest pain, shortness of breath, or palpitations.  Cardiac symptoms - Experienced 'flip-flopping feelings' in the chest over Christmas, attributed to hormonal changes; symptoms have resolved. - No current chest pain or palpitations.  Lipid management - On rosuvastatin  10 mg and aspirin  81 mg. - Lipid panel on August 24, 2024: total cholesterol 152, HDL 81, LDL 59 (improved from 88 in March 2025). - Lipoprotein A: 29. - No muscle aches or pains with statin therapy. - Started CoQ10 supplement with statin.  Echocardiographic findings - Echocardiogram on July 28, 2024, showed ejection fraction of 65% with normal diastology.  Medication tolerance and side effects - No issues with current medications, including no muscle aches or pains. - Bruises easily, attributed to aspirin  use.  Lifestyle and risk factor modification - Maintains a Mediterranean diet and regular exercise regimen, with no recent changes.          ROS: Remaining review of systems negative  Studies Reviewed: .         Results Labs Lipid panel (08/24/2024): Total cholesterol 152, HDL 81, LDL 59 decreased from 88 on 09/2023 Lipoprotein(a) (08/24/2024): 29, Within normal limits  Radiology Coronary calcium  score (08/08/2024): 223, 98th percentile for age, sex, and race-matched controls Coronary CT angiography (08/08/2024): Normal coronary origin with right dominance, mild stenosis of proximal LAD in D1 branch, minimal stenosis of distal left main, mid LAD, and mid circumflex, mild non-obstructive coronary artery disease  Diagnostic Echocardiogram (07/28/2024): Ejection fraction 65%, normal diastology, no other significant findings Risk Assessment/Calculations:             Physical Exam:   VS:  BP 106/62 (BP Location: Left Arm, Patient Position: Sitting, Cuff Size: Normal)   Pulse 81   Ht 5' 7 (1.702 m)   Wt 142 lb (64.4 kg)   SpO2 97%   BMI 22.24 kg/m    Wt Readings from Last 3 Encounters:  08/26/24 142 lb (64.4 kg)  06/15/24 140 lb 11.2 oz (63.8 kg)  09/26/23 143 lb (64.9 kg)    GEN: Well nourished, well developed in no acute distress NECK: No JVD;  CARDIAC: RRR, no murmurs, no rubs, no gallops RESPIRATORY:  Clear to auscultation without rales, wheezing or rhonchi  ABDOMEN: Soft, non-tender, non-distended EXTREMITIES:  No edema; No deformity   ASSESSMENT AND PLAN: .    Assessment and Plan Assessment & Plan Non-obstructive coronary artery disease with elevated coronary calcium  score Mild non-obstructive coronary artery disease with coronary calcium  score of 223, 98th percentile for age, sex, race. Mild stenosis in proximal  LAD, minimal stenosis in distal left main, mid LAD, mid circumflex. Explained aspirin 's role in preventing disease progression and minor bruising as a side effect. - Continue aspirin  81 mg daily. - Continue rosuvastatin  10 mg daily. - Maintain Mediterranean diet and regular exercise. - Monitor for new symptoms such as chest pain or exertional  discomfort.  Hyperlipidemia Well-controlled with rosuvastatin . LDL decreased from 88 to 59, below target of <70 for coronary artery disease. Normal lipoprotein(a) reduces independent risk. - Continue rosuvastatin  10 mg daily. - Repeat lipid panel in one year.            Follow up: 1 year  Signed, Tonya FORBES Calender, Patterson  08/26/2024 9:42 AM    [facility] "

## 2024-08-26 NOTE — Patient Instructions (Signed)
 Medication Instructions:  Your physician recommends that you continue on your current medications as directed. Please refer to the Current Medication list given to you today.  *If you need a refill on your cardiac medications before your next appointment, please call your pharmacy*  Lab Work: 1 year Lipids If you have labs (blood work) drawn today and your tests are completely normal, you will receive your results only by: MyChart Message (if you have MyChart) OR A paper copy in the mail If you have any lab test that is abnormal or we need to change your treatment, we will call you to review the results.  Your next appointment:   1 year(s)  Provider:   Emeline FORBES Calender, DO

## 2024-09-29 ENCOUNTER — Encounter: Admitting: Nurse Practitioner

## 2024-12-20 ENCOUNTER — Encounter: Admitting: Nurse Practitioner
